# Patient Record
Sex: Male | Born: 1940 | Race: White | Hispanic: No | Marital: Married | Smoking: Former smoker
Health system: Southern US, Community
[De-identification: ages and names within clinical notes are randomized; demographics above are authoritative.]

## PROBLEM LIST (undated history)

## (undated) DIAGNOSIS — K5790 Diverticulosis of intestine, part unspecified, without perforation or abscess without bleeding: Secondary | ICD-10-CM

## (undated) DIAGNOSIS — I10 Essential (primary) hypertension: Secondary | ICD-10-CM

## (undated) DIAGNOSIS — C4442 Squamous cell carcinoma of skin of scalp and neck: Secondary | ICD-10-CM

## (undated) DIAGNOSIS — N400 Enlarged prostate without lower urinary tract symptoms: Secondary | ICD-10-CM

## (undated) DIAGNOSIS — E785 Hyperlipidemia, unspecified: Secondary | ICD-10-CM

## (undated) DIAGNOSIS — I4510 Unspecified right bundle-branch block: Secondary | ICD-10-CM

## (undated) DIAGNOSIS — K279 Peptic ulcer, site unspecified, unspecified as acute or chronic, without hemorrhage or perforation: Secondary | ICD-10-CM

## (undated) DIAGNOSIS — N529 Male erectile dysfunction, unspecified: Secondary | ICD-10-CM

## (undated) DIAGNOSIS — Z800 Family history of malignant neoplasm of digestive organs: Secondary | ICD-10-CM

## (undated) HISTORY — PX: OTHER SURGICAL HISTORY: SHX169

## (undated) HISTORY — DX: Family history of malignant neoplasm of digestive organs: Z80.0

## (undated) HISTORY — DX: Diverticulosis of intestine, part unspecified, without perforation or abscess without bleeding: K57.90

## (undated) HISTORY — DX: Essential (primary) hypertension: I10

## (undated) HISTORY — DX: Squamous cell carcinoma of skin of scalp and neck: C44.42

## (undated) HISTORY — DX: Male erectile dysfunction, unspecified: N52.9

## (undated) HISTORY — DX: Hyperlipidemia, unspecified: E78.5

## (undated) HISTORY — DX: Peptic ulcer, site unspecified, unspecified as acute or chronic, without hemorrhage or perforation: K27.9

## (undated) HISTORY — DX: Unspecified right bundle-branch block: I45.10

## (undated) HISTORY — DX: Benign prostatic hyperplasia without lower urinary tract symptoms: N40.0

---

## 2000-04-07 ENCOUNTER — Encounter (INDEPENDENT_AMBULATORY_CARE_PROVIDER_SITE_OTHER): Payer: Self-pay

## 2000-04-07 ENCOUNTER — Ambulatory Visit (HOSPITAL_COMMUNITY): Admission: RE | Admit: 2000-04-07 | Discharge: 2000-04-07 | Payer: Self-pay | Admitting: Gastroenterology

## 2000-11-02 ENCOUNTER — Encounter: Payer: Self-pay | Admitting: *Deleted

## 2000-11-02 ENCOUNTER — Encounter: Payer: Self-pay | Admitting: Internal Medicine

## 2000-11-02 ENCOUNTER — Inpatient Hospital Stay (HOSPITAL_COMMUNITY): Admission: EM | Admit: 2000-11-02 | Discharge: 2000-11-14 | Payer: Self-pay | Admitting: Emergency Medicine

## 2000-11-03 ENCOUNTER — Encounter: Payer: Self-pay | Admitting: Internal Medicine

## 2000-11-04 ENCOUNTER — Encounter: Payer: Self-pay | Admitting: Internal Medicine

## 2000-11-08 ENCOUNTER — Encounter: Payer: Self-pay | Admitting: Internal Medicine

## 2000-11-17 ENCOUNTER — Ambulatory Visit (HOSPITAL_COMMUNITY): Admission: RE | Admit: 2000-11-17 | Discharge: 2000-11-17 | Payer: Self-pay | Admitting: Family Medicine

## 2000-11-25 ENCOUNTER — Ambulatory Visit (HOSPITAL_COMMUNITY): Admission: RE | Admit: 2000-11-25 | Discharge: 2000-11-25 | Payer: Self-pay

## 2004-11-22 HISTORY — PX: COLONOSCOPY: SHX174

## 2005-06-09 ENCOUNTER — Encounter (INDEPENDENT_AMBULATORY_CARE_PROVIDER_SITE_OTHER): Payer: Self-pay | Admitting: *Deleted

## 2005-06-09 ENCOUNTER — Ambulatory Visit (HOSPITAL_COMMUNITY): Admission: RE | Admit: 2005-06-09 | Discharge: 2005-06-09 | Payer: Self-pay | Admitting: Gastroenterology

## 2006-05-11 ENCOUNTER — Ambulatory Visit: Payer: Self-pay | Admitting: Family Medicine

## 2006-05-19 ENCOUNTER — Ambulatory Visit: Payer: Self-pay | Admitting: Family Medicine

## 2007-05-24 ENCOUNTER — Ambulatory Visit: Payer: Self-pay | Admitting: Family Medicine

## 2007-06-01 ENCOUNTER — Ambulatory Visit: Payer: Self-pay | Admitting: Family Medicine

## 2007-09-05 ENCOUNTER — Ambulatory Visit: Payer: Self-pay | Admitting: Family Medicine

## 2007-09-12 ENCOUNTER — Ambulatory Visit (HOSPITAL_COMMUNITY): Admission: RE | Admit: 2007-09-12 | Discharge: 2007-09-12 | Payer: Self-pay | Admitting: Family Medicine

## 2008-03-14 ENCOUNTER — Ambulatory Visit: Payer: Self-pay | Admitting: Family Medicine

## 2008-10-08 ENCOUNTER — Ambulatory Visit: Payer: Self-pay | Admitting: Family Medicine

## 2009-04-16 ENCOUNTER — Ambulatory Visit: Payer: Self-pay | Admitting: Family Medicine

## 2009-04-24 ENCOUNTER — Ambulatory Visit: Payer: Self-pay | Admitting: Family Medicine

## 2009-10-07 ENCOUNTER — Ambulatory Visit: Payer: Self-pay | Admitting: Family Medicine

## 2010-11-10 ENCOUNTER — Ambulatory Visit: Payer: Self-pay | Admitting: Family Medicine

## 2011-01-05 ENCOUNTER — Other Ambulatory Visit: Payer: Self-pay | Admitting: Gastroenterology

## 2011-04-09 NOTE — Discharge Summary (Signed)
[place]  Patient:    GUSSIE, TOWSON Visit Number: [id number] MRN: [id number]          Service Type: MED Location: [phone] Attending Physician:  Carollee Herter Admit Date:  11/02/2000 Discharge Date: 11/14/2000                             Discharge Summary  HISTORY OF PRESENT ILLNESS:  This is a 70 year old white male who was admitted through the emergency room for evaluation and treatment of obtundation.  He has a longstanding history of binge drinking and went on another binge prior to his admission to the hospital.  He is usually able to sober up from these; however, his wife took the alcohol away and he continued to deteriorate.  He was brought to the emergency room where his sodium was confirmed on two different specimens to be less than 100, chloride was 61, his BUN and creatinine were normal.   His nasogastric secretions were guaiac positive. White blood cell count was 24,000.  ADMISSION PHYSICAL EXAMINATION:  GENERAL APPEARANCE:  An obtunded and somewhat combative male who was noted to be in sinus tachycardia.  SKIN:  Warm and dry.  HEENT:  There were multiple facial contusions, especially in the periorbital area as well as a scalp laceration.  CHEST:  There were coarse breath sounds with rales and rhonchi.  ABDOMEN:  There was slight distention.  RECTAL:  Guaiac negative.  NEUROLOGICAL:  He was agitated and combative and intubation was done in order to allow for CT scan to rule out any CNS bleed.  HOSPITAL COURSE:  He was admitted to the intensive care unit for treatment of profound hyponatremia, hypochloremia, as well as volume depletion leukocytosis.  He was given IM thiamine as well as fluids and continued sedation for respiratory support.  CT of the brain did rule out any subdural. He was also placed on antibiotics for coverage for any possible sepsis due to elevated white blood cell count.  He was  initially placed on Rocephin for antibiotic coverage. Further evaluation in the ICU showed elevated amylase and lipase as well as a low albumin.  His CPK was also noted to be 69 66 as well as a slightly elevated bilirubin.  Also through the course his blood sugar did drop; however, toward the end of his hospital stay he showed slightly elevated blood sugars.  His condition slowly improved with  proper IV fluid replacements.  Blood cultures all were negative.  He was also given parenteral nutrition.  His electrolytes slowly returned to normal as did his CPK, amylase and lipase also diminished.  Upon discharge his sodium was 135, potassium 3.7, chloride 104, CO2 27, glucose on discharge was 167.  His final hemoglobin was 8.5, hematocrit 25.8.  Final ALT before discharge was 59, total bilirubin was 1.3.  His CPK returned to near normal as well.  He was transferred to a regular floor after being extubated and started on regular fluids and diet. He slowly gained some of his strength back.  Physical therapy as well as occupational therapy consults were obtained.  He also had some difficulty with peripheral edema although no therapy was instituted for this, hoping that as he became more active, this would remedy itself.  A home health consult was obtained to follow up on his blood sugar to closely monitor that.  CONDITION ON DISCHARGE:  Improved.  FINAL DIAGNOSES:  1. Hyponatremia and hypovolemia with hypocalcemia. 2. Alcohol dependence. 3. Respiratory failure. 4. Acute pancreatitis. 5. Rhabdomyolysis. 6. History of hypertension. 7. Contusion and laceration to the face.  FOLLOW-UP: He is to follow up in my office in approximately one week.  All abnormal labs will be followed up at that time. Attending Physician:  Carollee Herter DD:  12/20/00 TD:  12/20/00 Job: [id number]

## 2011-04-09 NOTE — Procedures (Signed)
[facility]  Patient:    Maxwell Sanders, Maxwell Sanders                        MRN: [id number] Proc. Date: 04/07/00 Attending:  Verlin Grills, M.D. CC:         Ronnald Nian, M.D.                           Procedure Report  PROCEDURE:  Colonoscopy.  REFERRING PHYSICIAN:  Ronnald Nian, M.D.  INDICATION FOR PROCEDURE:  Mr. Tyquavious Gamel is a 70 year old male with a positive family history for colon cancer. He is due for surveillance colonoscopy.  I discussed with the patient the complications associated with colonoscopy and polypectomy including intestinal bleeding and intestinal perforation. The patient has signed the operative permit.  ENDOSCOPIST:  Verlin Grills, M.D.  PREMEDICATION:  Demerol 100 mg, Versed 10 mg.  ENDOSCOPE:  Olympus pediatric colonoscope.  DESCRIPTION OF PROCEDURE:  After obtaining informed consent, the patient was placed in the left lateral decubitus position. I administered intravenous Demerol and intravenous Versed to achieve conscious sedation for the procedure. The patients cardiac rhythm, oxygen saturation and blood pressure were monitored throughout the procedure and documented in the medical record.  Anal inspection was normal. Digital rectal exam revealed a non-nodular prostate. The Olympus pediatric video colonoscope was introduced into the rectum and under direct vision advanced to the cecum as identified by a normal appearing ileocecal valve. Colonic preparation for the exam today was excellent.  RECTUM:  From the distal rectum, two 1 mm sessile polyps were removed with the cold biopsy forceps and submitted for pathological interpretation.  SIGMOID COLON/DESCENDING COLON:  Colonic diverticulosis.  SPLENIC FLEXURE:  Normal.  TRANSVERSE COLON:  Normal.  HEPATIC FLEXURE:  Normal.  ASCENDING COLON:  Normal.  CECUM/ILEOCECAL VALVE:  Normal.  ASSESSMENT: 1. Left colonic diverticulosis. 2. From the  distal rectum, two 1 mm sessile polyps were removed with the cold    biopsy forceps and submitted for pathological interpretation.  RECOMMENDATION:  Repeat colonoscopy in May 2006. DD:  04/07/00 TD:  04/12/00 Job: [id number]

## 2011-04-09 NOTE — Op Note (Signed)
NAMEMILBURN, FREENEY NO.:  [id number]   MEDICAL RECORD NO.:  [id number]          PATIENT TYPE:  AMB   LOCATION:  ENDO                         FACILITY:  [facility]   PHYSICIAN:  Danise Edge, M.D.   DATE OF BIRTH:  12-28-1940   DATE OF PROCEDURE:  06/09/2005  DATE OF DISCHARGE:                                 OPERATIVE REPORT   PROCEDURE:  Colonoscopy.   INDICATIONS:  [name] is a 70 year old male born 1941-09-17. Mr.  Maxwell Sanders mother was diagnosed with colon cancer. Five years ago, Mr.  Maxwell Sanders underwent a screening colonoscopy which revealed left colonic  diverticulosis and no colorectal neoplasia.   ENDOSCOPIST:  Danise Edge, M.D.   PREMEDICATION:  Versed 9 mg, Demerol 70 mg.   DESCRIPTION OF PROCEDURE:  After obtaining informed consent Maxwell Sanders was  placed in the left lateral decubitus position. I administered intravenous  Demerol and intravenous Versed to achieve conscious sedation for the  procedure. The patient's blood pressure, oxygen saturation and cardiac  rhythm were monitored throughout the procedure and documented in the medical  record.   Anal inspection was normal. Digital rectal exam revealed a nonnodular  prostate. The Olympus adjustable pediatric colonoscope was introduced into  the rectum and advanced to the cecum. A normal-appearing appendiceal orifice  and ileocecal valve were identified. Colonic preparation for the exam today  was satisfactory.   RECTUM:  A 1 mm sessile polyp was removed from the mid rectum with the cold  biopsy forceps.  SIGMOID COLON AND DESCENDING COLON:  Extensive left colonic diverticulosis.  SPLENIC FLEXURE:  Normal.  TRANSVERSE COLON:  Normal.  HEPATIC FLEXURE:  Normal.  ASCENDING COLON:  Normal.  CECUM AND ILEOCECAL VALVE:  From the proximal cecum, a 1 mm sessile polyp  was removed with the cold biopsy forceps.   ASSESSMENT:  1.  Extensive left colonic diverticulosis.  2.  A diminutive  polyp was removed from the cecum and a diminutive polyp was      removed from the rectum.   RECOMMENDATIONS:  Repeat colonoscopy in five years signed.       MJ/MEDQ  D:  06/09/2005  T:  06/09/2005  Job:  [id number]   cc:   Sharlot Gowda, M.D.  [address]  [place], [place] [address]  Fax: [phone]
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
## 2011-08-24 ENCOUNTER — Other Ambulatory Visit: Payer: Self-pay | Admitting: Family Medicine

## 2011-11-22 ENCOUNTER — Other Ambulatory Visit: Payer: Self-pay | Admitting: Family Medicine

## 2012-04-03 ENCOUNTER — Ambulatory Visit (INDEPENDENT_AMBULATORY_CARE_PROVIDER_SITE_OTHER): Payer: Medicare Other | Admitting: Family Medicine

## 2012-04-03 VITALS — BP 140/70 | HR 84 | Temp 97.9°F | Resp 16 | Wt 180.0 lb

## 2012-04-03 DIAGNOSIS — IMO0002 Reserved for concepts with insufficient information to code with codable children: Secondary | ICD-10-CM

## 2012-04-03 DIAGNOSIS — L03113 Cellulitis of right upper limb: Secondary | ICD-10-CM

## 2012-04-03 MED ORDER — DOXYCYCLINE HYCLATE 100 MG PO TABS: 100.0000 mg | ORAL_TABLET | Freq: Two times a day (BID) | ORAL | Status: AC

## 2012-04-03 NOTE — Patient Instructions (Signed)
Wash with soap and water and if you want to you can put heat on it for 20 minutes a couple times per day and I'll see back later this week

## 2012-04-03 NOTE — Progress Notes (Signed)
  Subjective:    Patient ID: Maxwell Sanders, male    DOB: 1941-08-30, 71 y.o.   MRN: [id number]  HPI He is here for evaluation of redness and swelling of his right forearm after pulling to ticks off 2 days ago. He's had no fever, chills, malaise or myalgias.   Review of Systems     Objective:   Physical Exam Both ticks were evaluated and none had had a blood meal. Exam of the right forearm does show redness, warmth, swelling to the lateral aspect of forearm. There is some central fullness in that area.       Assessment & Plan:   1. Cellulitis of forearm, right  doxycycline (VIBRA-TABS) 100 MG tablet   he is to watch for worsening of his infection and call me. Otherwise I will see him in 3 days.

## 2012-04-07 ENCOUNTER — Encounter: Payer: Self-pay | Admitting: Family Medicine

## 2012-04-07 ENCOUNTER — Ambulatory Visit (INDEPENDENT_AMBULATORY_CARE_PROVIDER_SITE_OTHER): Payer: Medicare Other | Admitting: Family Medicine

## 2012-04-07 VITALS — Wt 180.0 lb

## 2012-04-07 DIAGNOSIS — N529 Male erectile dysfunction, unspecified: Secondary | ICD-10-CM

## 2012-04-07 DIAGNOSIS — L03113 Cellulitis of right upper limb: Secondary | ICD-10-CM

## 2012-04-07 DIAGNOSIS — IMO0002 Reserved for concepts with insufficient information to code with codable children: Secondary | ICD-10-CM

## 2012-04-07 MED ORDER — TADALAFIL 20 MG PO TABS: 20.0000 mg | ORAL_TABLET | Freq: Every day | ORAL | Status: DC | PRN

## 2012-04-07 NOTE — Progress Notes (Signed)
  Subjective:    Patient ID: Maxwell Sanders, male    DOB: 08/12/1941, 71 y.o.   MRN: [id number]  HPI He is here for a recheck after recent forearm infection and treatment with antibiotic. He states the next day the swelling and redness did diminish. He continues on Vibra-Tabs. He also has difficulty with erectile dysfunction and in the past has used Viagra and Levitra. He is not sure whether either one worked very well. He can get an erection but cannot maintain it.   Review of Systems     Objective:   Physical Exam Alert and in no distress. Exam of the right forearm shows less swelling and erythema. It is not warm or tender to touch.       Assessment & Plan:   1. Cellulitis of right forearm    2. ED (erectile dysfunction)  tadalafil (CIALIS) 20 MG tablet   he will continue on doxycycline to is gone. Discussed use of Cialis and possible side effects with him. Samples given. He will let me know how it works.

## 2012-04-18 ENCOUNTER — Telehealth: Payer: Self-pay | Admitting: Family Medicine

## 2012-04-18 DIAGNOSIS — N529 Male erectile dysfunction, unspecified: Secondary | ICD-10-CM

## 2012-04-18 MED ORDER — TADALAFIL 20 MG PO TABS: 20.0000 mg | ORAL_TABLET | Freq: Every day | ORAL | Status: DC | PRN

## 2012-04-18 NOTE — Telephone Encounter (Signed)
PT STATES THAT HE WAS SUPPOSED TO CALL us BACK IF HE WANT TO GET SCRIPT FOR CIALIS 20MG . CALLED THIS MORNING TO REQUEST SCRIPT FOR CIALIS BUT WAS TOLD THAT MED WAS SENT IN TO [facility] ON 5/17 BUT PHARMACY TOLD HIM THEY DO NOT HAVE RECORD OF THIS SO HE NEED MED SENT IN TO [email or web address]. PLEASE CALL WHEN THIS IS DONE SO PT KNOWS WHEN HE CAN GO BACK TO PHARMACY

## 2012-04-18 NOTE — Telephone Encounter (Signed)
Called pt to inform him Dr.Lalonde called pt med in for him

## 2012-04-18 NOTE — Telephone Encounter (Signed)
Let him know that I just called it in. 

## 2012-04-19 ENCOUNTER — Encounter: Payer: Self-pay | Admitting: Internal Medicine

## 2012-04-21 ENCOUNTER — Encounter: Payer: Self-pay | Admitting: Family Medicine

## 2012-04-21 ENCOUNTER — Other Ambulatory Visit: Payer: Self-pay | Admitting: Family Medicine

## 2012-04-21 ENCOUNTER — Ambulatory Visit (INDEPENDENT_AMBULATORY_CARE_PROVIDER_SITE_OTHER): Payer: Medicare Other | Admitting: Family Medicine

## 2012-04-21 VITALS — BP 138/70 | HR 86 | Ht 68.0 in | Wt 180.0 lb

## 2012-04-21 DIAGNOSIS — Z23 Encounter for immunization: Secondary | ICD-10-CM

## 2012-04-21 DIAGNOSIS — Z79899 Other long term (current) drug therapy: Secondary | ICD-10-CM

## 2012-04-21 DIAGNOSIS — I10 Essential (primary) hypertension: Secondary | ICD-10-CM

## 2012-04-21 DIAGNOSIS — J302 Other seasonal allergic rhinitis: Secondary | ICD-10-CM | POA: Insufficient documentation

## 2012-04-21 DIAGNOSIS — I152 Hypertension secondary to endocrine disorders: Secondary | ICD-10-CM | POA: Insufficient documentation

## 2012-04-21 DIAGNOSIS — R972 Elevated prostate specific antigen [PSA]: Secondary | ICD-10-CM | POA: Insufficient documentation

## 2012-04-21 DIAGNOSIS — N529 Male erectile dysfunction, unspecified: Secondary | ICD-10-CM

## 2012-04-21 DIAGNOSIS — E1169 Type 2 diabetes mellitus with other specified complication: Secondary | ICD-10-CM | POA: Insufficient documentation

## 2012-04-21 DIAGNOSIS — Z0000 Encounter for general adult medical examination without abnormal findings: Secondary | ICD-10-CM

## 2012-04-21 DIAGNOSIS — E1159 Type 2 diabetes mellitus with other circulatory complications: Secondary | ICD-10-CM

## 2012-04-21 DIAGNOSIS — J309 Allergic rhinitis, unspecified: Secondary | ICD-10-CM

## 2012-04-21 DIAGNOSIS — E119 Type 2 diabetes mellitus without complications: Secondary | ICD-10-CM

## 2012-04-21 DIAGNOSIS — E118 Type 2 diabetes mellitus with unspecified complications: Secondary | ICD-10-CM | POA: Insufficient documentation

## 2012-04-21 DIAGNOSIS — E785 Hyperlipidemia, unspecified: Secondary | ICD-10-CM

## 2012-04-21 LAB — LIPID PANEL
HDL: 52 mg/dL (ref >39)
LDL Cholesterol: 81 mg/dL (ref 0–99)
Total CHOL/HDL Ratio: 2.8 Ratio
Triglycerides: 69 mg/dL (ref <150)
VLDL: 14 mg/dL (ref 0–40)

## 2012-04-21 LAB — COMPREHENSIVE METABOLIC PANEL
AST: 23 U/L (ref 0–37)
Albumin: 4.7 g/dL (ref 3.5–5.2)
Alkaline Phosphatase: 56 U/L (ref 39–117)
BUN: 14 mg/dL (ref 6–23)
Creat: 0.93 mg/dL (ref 0.50–1.35)
Glucose, Bld: 161 mg/dL — ABNORMAL HIGH (ref 70–99)
Potassium: 4.4 mEq/L (ref 3.5–5.3)
Total Bilirubin: 0.7 mg/dL (ref 0.3–1.2)

## 2012-04-21 LAB — POCT URINALYSIS DIPSTICK
Bilirubin, UA: NEGATIVE
Blood, UA: NEGATIVE
Ketones, UA: NEGATIVE
Spec Grav, UA: 1.01
pH, UA: 7

## 2012-04-21 LAB — CBC WITH DIFFERENTIAL/PLATELET
Basophils Absolute: 0 10*3/uL (ref 0.0–0.1)
Basophils Relative: 1 % (ref 0–1)
Eosinophils Absolute: 0.1 10*3/uL (ref 0.0–0.7)
HCT: 44.9 % (ref 39.0–52.0)
MCH: 32.6 pg (ref 26.0–34.0)
MCHC: 33 g/dL (ref 30.0–36.0)
Monocytes Absolute: 0.6 10*3/uL (ref 0.1–1.0)
Monocytes Relative: 8 % (ref 3–12)
Neutro Abs: 4.7 10*3/uL (ref 1.7–7.7)
Neutrophils Relative %: 67 % (ref 43–77)
RDW: 13.1 % (ref 11.5–15.5)

## 2012-04-21 LAB — POCT UA - MICROALBUMIN: Microalbumin Ur, POC: <5 mg/dL

## 2012-04-21 MED ORDER — LISINOPRIL-HYDROCHLOROTHIAZIDE 20-12.5 MG PO TABS: 1.0000 | ORAL_TABLET | Freq: Every day | ORAL | Status: DC

## 2012-04-21 MED ORDER — METOPROLOL TARTRATE 25 MG PO TABS: 25.0000 mg | ORAL_TABLET | Freq: Two times a day (BID) | ORAL | Status: DC

## 2012-04-21 MED ORDER — TADALAFIL 20 MG PO TABS: 20.0000 mg | ORAL_TABLET | Freq: Every day | ORAL | Status: DC | PRN

## 2012-04-21 MED ORDER — SIMVASTATIN 20 MG PO TABS: 20.0000 mg | ORAL_TABLET | Freq: Every day | ORAL | Status: DC

## 2012-04-21 NOTE — Progress Notes (Signed)
Subjective:    Patient ID: Maxwell Sanders, male    DOB: Nov 26, 1940, 71 y.o.   MRN: [id number]  HPI He is here for an interval evaluation. He would like a prescription for Cialis so he can send it to a [place] company. He says he can get much cheaper that way. He would also like followup on the PSA. He does not remember [place] discussing this in the past however the record does indicate otherwise. He does have seasonal allergies and rarely takes anything for them. This usually bothers him when he goes outside and mow his lawn or in the spring. He continues on other medications listed in the chart. He does check his blood sugars periodically and they usually run below 130. He also has a lesion in the left side of his nose that intermittently gives him difficulty. His social and family history were reviewed.   Review of Systems Negative except as above    Objective:   Physical Exam BP 138/70  Pulse 86  Ht 5\' 8"  (1.727 m)  Wt 180 lb (81.647 kg)  BMI 27.37 kg/m2  SpO2 98%  General Appearance:    Alert, cooperative, no distress, appears stated age  Head:    Normocephalic, without obvious abnormality, atraumatic  Eyes:    PERRL, conjunctiva/corneas clear, EOM's intact, fundi    benign  Ears:    Normal TM's and external ear canals  Nose:   Nares normal, mucosa on the left septal area does show questionable growth, no drainage or sinus   tenderness  Throat:   Lips, mucosa, and tongue normal; teeth and gums normal  Neck:   Supple, no lymphadenopathy;  thyroid:  no   enlargement/tenderness/nodules; no carotid   bruit or JVD  Back:    Spine nontender, no curvature, ROM normal, no CVA     tenderness  Lungs:     Clear to auscultation bilaterally without wheezes, rales or     ronchi; respirations unlabored  Chest Wall:    No tenderness or deformity   Heart:    Regular rate and rhythm, S1 and S2 normal, no murmur, rub   or gallop  Breast Exam:    No chest wall tenderness, masses or gynecomastia    Abdomen:     Soft, non-tender, nondistended, normoactive bowel sounds,   no masses, no hepatosplenomegaly  Genitalia:   deferred   Rectal:    deferred   Extremities:   No clubbing, cyanosis or edema  Pulses:   2+ and symmetric all extremities  Skin:   Skin color, texture, turgor normal, no rashes or lesions  Lymph nodes:   Cervical, supraclavicular, and axillary nodes normal  Neurologic:   CNII-XII intact, normal strength, sensation and gait; reflexes 2+ and symmetric throughout          Psych:   Normal mood, affect, hygiene and grooming.           Assessment & Plan:   1. Routine general medical examination at a health care facility  POCT Urinalysis Dipstick, Tdap vaccine greater than or equal to 7yo IM, US Aorta Initial Medicare Screen  2. ED (erectile dysfunction)  tadalafil (CIALIS) 20 MG tablet  3. Elevated PSA  PSA Total+%Free (Serial)  4. Allergic rhinitis, seasonal    5. Type II or unspecified type diabetes mellitus without mention of complication, not stated as uncontrolled  POCT UA - Microalbumin, POCT HgB A1C  6. Hypertension associated with diabetes  lisinopril-hydrochlorothiazide (PRINZIDE,ZESTORETIC) 20-12.5 MG per tablet, metoprolol tartrate (  LOPRESSOR) 25 MG tablet  7. Hyperlipidemia LDL goal <70  simvastatin (ZOCOR) 20 MG tablet, Comprehensive metabolic panel, Lipid panel  8. Encounter for long-term (current) use of other medications  CBC with Differential, Comprehensive metabolic panel, Lipid panel, POCT UA - Microalbumin   9 nasal lesion                                                         I discussed referral to ENT but he would like to hold off on this. Recommend he call me when he is ready. his medications were renewed. Encouraged him to continue on his present medications as well as diet and exercise regimen.

## 2012-04-22 LAB — PSA, TOTAL AND FREE
PSA, Free Pct: 24 % (ref >25)
PSA, Free: 1.46 ng/mL

## 2012-04-24 NOTE — Progress Notes (Signed)
His PSA was elevated. I discussed the percent PSA with him and his risk of prostate cancer especially considering his age. He is comfortable with waiting. To set up an appointment for recheck in 6 months.

## 2012-04-25 ENCOUNTER — Ambulatory Visit (HOSPITAL_COMMUNITY): Payer: Medicare Other

## 2012-04-27 ENCOUNTER — Other Ambulatory Visit: Payer: Self-pay | Admitting: Family Medicine

## 2012-04-27 ENCOUNTER — Ambulatory Visit (HOSPITAL_COMMUNITY)
Admission: RE | Admit: 2012-04-27 | Discharge: 2012-04-27 | Disposition: A | Discharge status: Home / Self Care | Payer: Medicare Other | Source: Ambulatory Visit | Attending: Family Medicine | Admitting: Family Medicine

## 2012-04-27 DIAGNOSIS — I10 Essential (primary) hypertension: Secondary | ICD-10-CM | POA: Insufficient documentation

## 2012-04-27 DIAGNOSIS — Z0000 Encounter for general adult medical examination without abnormal findings: Secondary | ICD-10-CM | POA: Insufficient documentation

## 2012-04-27 DIAGNOSIS — Z87891 Personal history of nicotine dependence: Secondary | ICD-10-CM | POA: Insufficient documentation

## 2012-04-27 DIAGNOSIS — E119 Type 2 diabetes mellitus without complications: Secondary | ICD-10-CM | POA: Insufficient documentation

## 2012-12-07 ENCOUNTER — Encounter: Payer: Self-pay | Admitting: Family Medicine

## 2012-12-07 ENCOUNTER — Ambulatory Visit (INDEPENDENT_AMBULATORY_CARE_PROVIDER_SITE_OTHER): Payer: MEDICARE | Admitting: Family Medicine

## 2012-12-07 VITALS — BP 136/88 | HR 79 | Wt 185.0 lb

## 2012-12-07 DIAGNOSIS — R972 Elevated prostate specific antigen [PSA]: Secondary | ICD-10-CM

## 2012-12-07 DIAGNOSIS — Z23 Encounter for immunization: Secondary | ICD-10-CM

## 2012-12-07 DIAGNOSIS — E119 Type 2 diabetes mellitus without complications: Secondary | ICD-10-CM

## 2012-12-07 DIAGNOSIS — I152 Hypertension secondary to endocrine disorders: Secondary | ICD-10-CM

## 2012-12-07 DIAGNOSIS — E1169 Type 2 diabetes mellitus with other specified complication: Secondary | ICD-10-CM

## 2012-12-07 DIAGNOSIS — I10 Essential (primary) hypertension: Secondary | ICD-10-CM

## 2012-12-07 DIAGNOSIS — E1159 Type 2 diabetes mellitus with other circulatory complications: Secondary | ICD-10-CM

## 2012-12-07 NOTE — Patient Instructions (Signed)
Work on getting your weight down and as long as your blood sugars 2 hours after you eat don't  go close to 300 I'm not going to worry

## 2012-12-07 NOTE — Progress Notes (Signed)
  Subjective:    Patient ID: Maxwell Sanders, male    DOB: 11/08/1941, 72 y.o.   MRN: [id number]  HPI He is here for an interval evaluation. He has a previous history of elevated PSA. This was discussed with him in the past and it was decided to observe this. He was comfortable with this approach and is here for followup on his PSA. Also of note is the fact that his blood sugars have been slowly increasing. He did get his weight down to 165 and did note that his blood sugars did return to normal. Recently he has noted blood sugars after eating in the 190-200 range which has him quite concerned.   Review of Systems     Objective:   Physical Exam Alert and in no distress. Hemoglobin A1c 7.7       Assessment & Plan:   1. Hypertension associated with diabetes    2. Type II or unspecified type diabetes mellitus without mention of complication, not stated as uncontrolled  POCT glycosylated hemoglobin (Hb A1C)  3. Elevated PSA  PSA Total+%Free (Serial)   I discussed the hemoglobin A1c of 7.7 in regard to weight reduction and medications. He will attempt to get his weight down instead of being placed on medications. Check this in 4 months.

## 2012-12-08 LAB — PSA, TOTAL AND FREE: PSA, Free: 1.69 ng/mL

## 2012-12-11 NOTE — Progress Notes (Signed)
Quick Note:  CALLED PT INFORMED HIM OF HIS LABS AND HAVE REFERRED HIM TO UROLOGY ______

## 2013-01-31 ENCOUNTER — Telehealth: Payer: Self-pay | Admitting: Family Medicine

## 2013-01-31 NOTE — Telephone Encounter (Signed)
CHANGING PROVIDERS FOR HIS MEDICAL SUPPLIES  NEEDS NEW RX FOR HIS   90 day supply  Lancets, solution and strips  Bayer countour meter    [facility]

## 2013-02-01 ENCOUNTER — Other Ambulatory Visit: Payer: Self-pay

## 2013-02-01 MED ORDER — LANCETS MISC: 1.0000 | Freq: Every day | Status: DC

## 2013-02-01 MED ORDER — BAYER CONTOUR MONITOR W/DEVICE KIT: 1.0000 | PACK | Freq: Once | Status: DC

## 2013-02-01 MED ORDER — GLUCOSE BLOOD VI STRP: 1.0000 | ORAL_STRIP | Freq: Every day | Status: DC

## 2013-02-01 NOTE — Telephone Encounter (Signed)
Take care of this 

## 2013-02-01 NOTE — Telephone Encounter (Signed)
Sent in BAYER CONTOUR METER,STRIPS,LANCETS,TO [facility] PER NOTE

## 2013-02-02 ENCOUNTER — Other Ambulatory Visit: Payer: Self-pay

## 2013-02-02 MED ORDER — GLUCOSE BLOOD VI STRP: 1.0000 | ORAL_STRIP | Freq: Two times a day (BID) | Status: DC

## 2013-02-02 MED ORDER — LANCETS MISC: 1.0000 | Freq: Two times a day (BID) | Status: DC

## 2013-02-02 MED ORDER — BAYER CONTOUR MONITOR W/DEVICE KIT: 1.0000 | PACK | Freq: Two times a day (BID) | Status: DC

## 2013-02-02 NOTE — Telephone Encounter (Signed)
HAD TO RESEND DIABETES SUPPLY

## 2013-02-05 ENCOUNTER — Other Ambulatory Visit: Payer: Self-pay

## 2013-03-14 LAB — HM DIABETES EYE EXAM

## 2013-03-27 ENCOUNTER — Encounter: Payer: Self-pay | Admitting: Internal Medicine

## 2013-05-28 ENCOUNTER — Other Ambulatory Visit: Payer: Self-pay | Admitting: Family Medicine

## 2013-06-21 ENCOUNTER — Encounter: Payer: Self-pay | Admitting: Family Medicine

## 2013-06-21 ENCOUNTER — Ambulatory Visit (INDEPENDENT_AMBULATORY_CARE_PROVIDER_SITE_OTHER): Payer: Medicare Other | Admitting: Family Medicine

## 2013-06-21 VITALS — BP 160/72 | HR 85 | Ht 68.0 in | Wt 181.0 lb

## 2013-06-21 DIAGNOSIS — I10 Essential (primary) hypertension: Secondary | ICD-10-CM

## 2013-06-21 DIAGNOSIS — J309 Allergic rhinitis, unspecified: Secondary | ICD-10-CM

## 2013-06-21 DIAGNOSIS — N400 Enlarged prostate without lower urinary tract symptoms: Secondary | ICD-10-CM | POA: Insufficient documentation

## 2013-06-21 DIAGNOSIS — Z0000 Encounter for general adult medical examination without abnormal findings: Secondary | ICD-10-CM

## 2013-06-21 DIAGNOSIS — E119 Type 2 diabetes mellitus without complications: Secondary | ICD-10-CM

## 2013-06-21 DIAGNOSIS — Z23 Encounter for immunization: Secondary | ICD-10-CM

## 2013-06-21 DIAGNOSIS — R972 Elevated prostate specific antigen [PSA]: Secondary | ICD-10-CM

## 2013-06-21 DIAGNOSIS — J302 Other seasonal allergic rhinitis: Secondary | ICD-10-CM

## 2013-06-21 DIAGNOSIS — E785 Hyperlipidemia, unspecified: Secondary | ICD-10-CM

## 2013-06-21 DIAGNOSIS — I152 Hypertension secondary to endocrine disorders: Secondary | ICD-10-CM

## 2013-06-21 DIAGNOSIS — E1169 Type 2 diabetes mellitus with other specified complication: Secondary | ICD-10-CM

## 2013-06-21 DIAGNOSIS — N138 Other obstructive and reflux uropathy: Secondary | ICD-10-CM

## 2013-06-21 DIAGNOSIS — N401 Enlarged prostate with lower urinary tract symptoms: Secondary | ICD-10-CM

## 2013-06-21 LAB — POCT UA - MICROALBUMIN
Albumin/Creatinine Ratio, Urine, POC: 26.9
Creatinine, POC: 158.5 mg/dL

## 2013-06-21 LAB — COMPREHENSIVE METABOLIC PANEL
AST: 32 U/L (ref 0–37)
Albumin: 4.9 g/dL (ref 3.5–5.2)
Alkaline Phosphatase: 79 U/L (ref 39–117)
BUN: 13 mg/dL (ref 6–23)
Calcium: 9.9 mg/dL (ref 8.4–10.5)
Chloride: 97 mEq/L (ref 96–112)
Glucose, Bld: 200 mg/dL — ABNORMAL HIGH (ref 70–99)
Potassium: 4.3 mEq/L (ref 3.5–5.3)
Sodium: 137 mEq/L (ref 135–145)
Total Protein: 7.5 g/dL (ref 6.0–8.3)

## 2013-06-21 LAB — CBC WITH DIFFERENTIAL/PLATELET
HCT: 44.7 % (ref 39.0–52.0)
Hemoglobin: 15.1 g/dL (ref 13.0–17.0)
Lymphocytes Relative: 12 % (ref 12–46)
Monocytes Absolute: 1.2 10*3/uL — ABNORMAL HIGH (ref 0.1–1.0)
Monocytes Relative: 10 % (ref 3–12)
Neutro Abs: 9.4 10*3/uL — ABNORMAL HIGH (ref 1.7–7.7)
Neutrophils Relative %: 76 % (ref 43–77)
RBC: 4.71 MIL/uL (ref 4.22–5.81)
WBC: 12.2 10*3/uL — ABNORMAL HIGH (ref 4.0–10.5)

## 2013-06-21 LAB — LIPID PANEL
HDL: 66 mg/dL (ref >39)
LDL Cholesterol: 73 mg/dL (ref 0–99)
Triglycerides: 95 mg/dL (ref <150)

## 2013-06-21 MED ORDER — TADALAFIL 5 MG PO TABS: ORAL_TABLET | ORAL | Status: DC

## 2013-06-21 NOTE — Progress Notes (Signed)
  Subjective:    Patient ID: Maxwell Sanders, male    DOB: [birth year], 72 y.o.   MRN: [id number]  HPI He is here for an examination. He does have a history of elevated PSA however his had biopsies which were negative. He does note that when he uses Cialis, he has much less nocturia. Apparently a 20 mg tablet will help his nocturia for about 4 or 5 days. His allergies are under good control. He continues on his statin as well as blood pressure medication. He checks his blood sugars intermittently. He eats whatever he wants. His exercise is mainly doing yard work . He does not smoke and rarely drinks. Family and social history are recorded   Review of Systems Negative except as above    Objective:   Physical Exam BP 160/72  Pulse 85  Ht 5\' 8"  (1.727 m)  Wt 181 lb (82.101 kg)  BMI 27.53 kg/m2  General Appearance:    Alert, cooperative, no distress, appears stated age  Head:    Normocephalic, without obvious abnormality, atraumatic  Eyes:    PERRL, conjunctiva/corneas clear, EOM's intact, fundi    benign  Ears:    Normal TM's and external ear canals  Nose:   Nares normal, mucosa normal, no drainage or sinus   tenderness  Throat:   Lips, mucosa, and tongue normal; teeth and gums normal  Neck:   Supple, no lymphadenopathy;  thyroid:  no   enlargement/tenderness/nodules; no carotid   bruit or JVD  Back:    Spine nontender, no curvature, ROM normal, no CVA     tenderness  Lungs:     Clear to auscultation bilaterally without wheezes, rales or     ronchi; respirations unlabored  Chest Wall:    No tenderness or deformity   Heart:    Regular rate and rhythm, S1 and S2 normal, no murmur, rub   or gallop  Breast Exam:    No chest wall tenderness, masses or gynecomastia  Abdomen:     Soft, non-tender, nondistended, normoactive bowel sounds,    no masses, no hepatosplenomegaly        Extremities:   No clubbing, cyanosis or edema  Pulses:   2+ and symmetric all extremities  Skin:   Skin color,  texture, turgor normal, no rashes or lesions  Lymph nodes:   Cervical, supraclavicular, and axillary nodes normal  Neurologic:   CNII-XII intact, normal strength, sensation and gait; reflexes 2+ and symmetric throughout          Psych:   Normal mood, affect, hygiene and grooming.   Hb A1c is 7.1       Assessment & Plan:  Type II or unspecified type diabetes mellitus without mention of complication, not stated as uncontrolled - Plan: POCT glycosylated hemoglobin (Hb A1C), POCT UA - Microalbumin, CBC with Differential, Comprehensive metabolic panel, Lipid panel  Allergic rhinitis, seasonal  Elevated PSA  Hyperlipidemia LDL goal <70 - Plan: Lipid panel  Hypertension associated with diabetes - Plan: CBC with Differential, Comprehensive metabolic panel  Hypertrophy of prostate with urinary obstruction and other lower urinary tract symptoms (LUTS) - Plan: tadalafil (CIALIS) 5 MG tablet  Immunization due - Plan: Pneumococcal conjugate vaccine 13-valent less than 5yo IM  did write a prescription for Cialis to see if his insurance will cover this however if it does not another prescription was written and he will send this off to [place].

## 2013-07-31 ENCOUNTER — Telehealth: Payer: Self-pay

## 2013-07-31 NOTE — Telephone Encounter (Signed)
DR.LALONDE THE PATIENT TOPED BY THIS MORNING AND SAID INS. WILL NOT PAY FOR HIS CIALIS BUT HE BROUGHT IN A ORDER FORM FROM [place] AND ASKED IF YOU WOULD GIVE AN RX FOR 20 MG # 100 BECAUSE IT IS THE CHEAPEST HE SAID YOU HAD HIM ON 5 MG DAILY AND HE SAID HE COULD TAKE THE 20MG  EVERY 3 DAYS AND IT WORKS THE SAME SO PLEASE ADVISE I HAVE THE FORM AND PRICE LIST

## 2013-07-31 NOTE — Telephone Encounter (Signed)
FAXED RX IN [place] BY [place]

## 2014-01-09 ENCOUNTER — Ambulatory Visit (INDEPENDENT_AMBULATORY_CARE_PROVIDER_SITE_OTHER): Payer: Medicare HMO | Admitting: Family Medicine

## 2014-01-09 ENCOUNTER — Encounter: Payer: Self-pay | Admitting: Family Medicine

## 2014-01-09 VITALS — BP 150/90 | HR 68 | Wt 184.0 lb

## 2014-01-09 DIAGNOSIS — R338 Other retention of urine: Secondary | ICD-10-CM

## 2014-01-09 LAB — POCT URINALYSIS DIPSTICK
Bilirubin, UA: NEGATIVE
Glucose, UA: 250
Ketones, UA: NEGATIVE
Leukocytes, UA: NEGATIVE
Nitrite, UA: NEGATIVE
SPEC GRAV UA: 1.015
UROBILINOGEN UA: 0.2
pH, UA: 7

## 2014-01-09 NOTE — Progress Notes (Signed)
   Subjective:    Patient ID: Maxwell Sanders, male    DOB: Jul 16, 1941, 73 y.o.   MRN: [id number]  HPI He complains of a one-day history of urinary hesitancy, decreased stream and feeling of incomplete emptying. He last emptied his bladder or 1:30 in the morning and has not been able to since then and now complains of lower abdominal discomfort.   Review of Systems     Objective:   Physical Exam Alert and in moderate distress. Urine dipstick did show red cells and glucose. Abdominal exam shows questionable fullness in the suprapubic area and pain on palpation.       Assessment & Plan:  Acute urinary retention - Plan: POCT urinalysis dipstick, Ambulatory referral to Urology  I will send him over to urology today for further evaluation and treatment.

## 2014-02-19 ENCOUNTER — Other Ambulatory Visit: Payer: Self-pay | Admitting: Family Medicine

## 2014-06-11 ENCOUNTER — Telehealth: Payer: Self-pay

## 2014-06-11 NOTE — Telephone Encounter (Signed)
Pt was informed that he was past due for his diabetes check he said he didn't know he was suppose to have it checked every 4 mths but he will call back to make an appointment for his physical

## 2014-06-11 NOTE — Telephone Encounter (Signed)
Called pt to inform him that he is past

## 2014-06-28 ENCOUNTER — Telehealth: Payer: Self-pay

## 2014-06-28 NOTE — Telephone Encounter (Signed)
PT INFORMED AND SAID HE DIDN'T KNOW ANYTHING ABOUT COMING IN EVERY 4 MTH FOR HIS DIABETES CHECK THAT HE WOULD SCHEDULE HIS YEARLY PHY

## 2014-07-08 ENCOUNTER — Ambulatory Visit (INDEPENDENT_AMBULATORY_CARE_PROVIDER_SITE_OTHER): Payer: Medicare HMO | Admitting: Family Medicine

## 2014-07-08 ENCOUNTER — Encounter: Payer: Self-pay | Admitting: Family Medicine

## 2014-07-08 VITALS — BP 150/80 | HR 70 | Ht 67.5 in | Wt 186.0 lb

## 2014-07-08 DIAGNOSIS — E1169 Type 2 diabetes mellitus with other specified complication: Secondary | ICD-10-CM

## 2014-07-08 DIAGNOSIS — I152 Hypertension secondary to endocrine disorders: Secondary | ICD-10-CM

## 2014-07-08 DIAGNOSIS — J302 Other seasonal allergic rhinitis: Secondary | ICD-10-CM

## 2014-07-08 DIAGNOSIS — E785 Hyperlipidemia, unspecified: Secondary | ICD-10-CM

## 2014-07-08 DIAGNOSIS — E1159 Type 2 diabetes mellitus with other circulatory complications: Secondary | ICD-10-CM

## 2014-07-08 DIAGNOSIS — R972 Elevated prostate specific antigen [PSA]: Secondary | ICD-10-CM

## 2014-07-08 DIAGNOSIS — I10 Essential (primary) hypertension: Secondary | ICD-10-CM

## 2014-07-08 DIAGNOSIS — J309 Allergic rhinitis, unspecified: Secondary | ICD-10-CM

## 2014-07-08 DIAGNOSIS — E119 Type 2 diabetes mellitus without complications: Secondary | ICD-10-CM

## 2014-07-08 DIAGNOSIS — N400 Enlarged prostate without lower urinary tract symptoms: Secondary | ICD-10-CM

## 2014-07-08 DIAGNOSIS — I4510 Unspecified right bundle-branch block: Secondary | ICD-10-CM

## 2014-07-08 LAB — POCT URINALYSIS DIPSTICK
Bilirubin, UA: NEGATIVE
Blood, UA: NEGATIVE
Glucose, UA: NEGATIVE
KETONES UA: NEGATIVE
LEUKOCYTES UA: NEGATIVE
Nitrite, UA: NEGATIVE
PROTEIN UA: NEGATIVE
Spec Grav, UA: 1.01
Urobilinogen, UA: NEGATIVE
pH, UA: 6

## 2014-07-08 LAB — POCT UA - MICROALBUMIN
Albumin/Creatinine Ratio, Urine, POC: <9.9
Creatinine, POC: 50.6 mg/dL

## 2014-07-08 LAB — POCT GLYCOSYLATED HEMOGLOBIN (HGB A1C): Hemoglobin A1C: 7.1

## 2014-07-08 MED ORDER — METOPROLOL TARTRATE 50 MG PO TABS: 50.0000 mg | ORAL_TABLET | Freq: Two times a day (BID) | ORAL | Status: DC

## 2014-07-08 NOTE — Progress Notes (Signed)
   Subjective:    Patient ID: Maxwell Sanders, male    DOB: 03/04/1941, 73 y.o.   MRN: [id number]  HPI    Review of Systems     Objective:   Physical Exam        Assessment & Plan:  EKG does show evidence of RBBB

## 2014-07-08 NOTE — Progress Notes (Signed)
   Subjective:    Patient ID: Maxwell Sanders, male    DOB: 05-31-1941, 73 y.o.   MRN: [id number]  HPI He is here for an interval evaluation. He is getting ready to have nasal surgery and the surgeon mention getting an EKG for general purposes. He's had no chest pain or shortness of breath or PND. He did have difficulty earlier this year with urinary retention. He does have a history of elevated PSA and BPH. At this point he seems to be doing fairly well. His allergies are under good control and rarely give him difficulty. He continues on his present medication regimen as well. He is to get an eye exam in November. Social and family history were reviewed.   Review of Systems  All other systems reviewed and are negative.      Objective:   Physical Exam alert and in no distress. Tympanic membranes and canals are normal. Throat is clear. Tonsils are normal. Neck is supple without adenopathy or thyromegaly. Cardiac exam shows a regular sinus rhythm without murmurs or gallops. Lungs are clear to auscultation. Lower extremity exam shows good pulses and reflexes.       Assessment & Plan:  BPH (benign prostatic hyperplasia)  Type II or unspecified type diabetes mellitus without mention of complication, not stated as uncontrolled - Plan: POCT glycosylated hemoglobin (Hb A1C), POCT UA - Microalbumin, POCT Urinalysis Dipstick, CBC with Differential, Comprehensive metabolic panel, Lipid panel, EKG 12-Lead, Ankle brachial index  Hypertension associated with diabetes - Plan: CBC with Differential, Comprehensive metabolic panel, Lipid panel, EKG 12-Lead, metoprolol (LOPRESSOR) 50 MG tablet  Hyperlipidemia LDL goal <70 - Plan: Lipid panel  Allergic rhinitis, seasonal  Elevated PSA

## 2014-07-09 ENCOUNTER — Ambulatory Visit (HOSPITAL_COMMUNITY)
Admission: RE | Admit: 2014-07-09 | Discharge: 2014-07-09 | Disposition: A | Discharge status: Home / Self Care | Payer: Medicare HMO | Source: Ambulatory Visit | Attending: Family Medicine | Admitting: Family Medicine

## 2014-07-09 DIAGNOSIS — E119 Type 2 diabetes mellitus without complications: Secondary | ICD-10-CM | POA: Insufficient documentation

## 2014-07-09 DIAGNOSIS — I739 Peripheral vascular disease, unspecified: Secondary | ICD-10-CM | POA: Insufficient documentation

## 2014-07-09 LAB — CBC WITH DIFFERENTIAL/PLATELET
BASOS ABS: 0.1 10*3/uL (ref 0.0–0.1)
Basophils Relative: 1 % (ref 0–1)
Eosinophils Absolute: 0.1 10*3/uL (ref 0.0–0.7)
Eosinophils Relative: 1 % (ref 0–5)
HEMATOCRIT: 40.9 % (ref 39.0–52.0)
Hemoglobin: 13.9 g/dL (ref 13.0–17.0)
LYMPHS PCT: 19 % (ref 12–46)
Lymphs Abs: 1.5 10*3/uL (ref 0.7–4.0)
MCH: 31.9 pg (ref 26.0–34.0)
MCHC: 34 g/dL (ref 30.0–36.0)
MCV: 93.8 fL (ref 78.0–100.0)
MONO ABS: 0.6 10*3/uL (ref 0.1–1.0)
Monocytes Relative: 8 % (ref 3–12)
NEUTROS ABS: 5.8 10*3/uL (ref 1.7–7.7)
NEUTROS PCT: 71 % (ref 43–77)
PLATELETS: 222 10*3/uL (ref 150–400)
RBC: 4.36 MIL/uL (ref 4.22–5.81)
RDW: 12.8 % (ref 11.5–15.5)
WBC: 8.1 10*3/uL (ref 4.0–10.5)

## 2014-07-09 LAB — COMPREHENSIVE METABOLIC PANEL
ALT: 17 U/L (ref 0–53)
AST: 19 U/L (ref 0–37)
Albumin: 4.3 g/dL (ref 3.5–5.2)
Alkaline Phosphatase: 64 U/L (ref 39–117)
BUN: 9 mg/dL (ref 6–23)
CALCIUM: 9.1 mg/dL (ref 8.4–10.5)
CHLORIDE: 104 meq/L (ref 96–112)
CO2: 23 mEq/L (ref 19–32)
Creat: 0.68 mg/dL (ref 0.50–1.35)
Glucose, Bld: 126 mg/dL — ABNORMAL HIGH (ref 70–99)
POTASSIUM: 4 meq/L (ref 3.5–5.3)
SODIUM: 138 meq/L (ref 135–145)
Total Bilirubin: 0.7 mg/dL (ref 0.2–1.2)
Total Protein: 6.3 g/dL (ref 6.0–8.3)

## 2014-07-09 LAB — LIPID PANEL
Cholesterol: 119 mg/dL (ref 0–200)
HDL: 44 mg/dL (ref >39)
LDL Cholesterol: 62 mg/dL (ref 0–99)
Total CHOL/HDL Ratio: 2.7 Ratio
Triglycerides: 63 mg/dL (ref <150)
VLDL: 13 mg/dL (ref 0–40)

## 2014-07-09 NOTE — Progress Notes (Signed)
VASCULAR LAB PRELIMINARY  ARTERIAL  ABI completed:    RIGHT    LEFT    PRESSURE WAVEFORM  PRESSURE WAVEFORM  BRACHIAL 129 triphasic BRACHIAL 128 triphasic  DP   DP    AT 149 triphasic AT 97 monophasic  PT 142 triphasic PT 82 monophasic  PER   PER    GREAT TOE  NA GREAT TOE  NA    RIGHT LEFT  ABI >1.0 0.75     Real Cona, RVT 07/09/2014, 12:15 PM

## 2014-07-16 ENCOUNTER — Other Ambulatory Visit: Payer: Self-pay | Admitting: Family Medicine

## 2014-08-18 ENCOUNTER — Other Ambulatory Visit: Payer: Self-pay | Admitting: Family Medicine

## 2014-11-27 ENCOUNTER — Telehealth: Payer: Self-pay | Admitting: Internal Medicine

## 2014-11-27 MED ORDER — LISINOPRIL-HYDROCHLOROTHIAZIDE 20-12.5 MG PO TABS: 1.0000 | ORAL_TABLET | Freq: Every day | ORAL | Status: DC

## 2014-11-27 MED ORDER — SIMVASTATIN 20 MG PO TABS: 20.0000 mg | ORAL_TABLET | Freq: Every day | ORAL | Status: DC

## 2014-11-27 NOTE — Telephone Encounter (Signed)
Done

## 2014-11-27 NOTE — Telephone Encounter (Signed)
Pt needs a refill on lisinopril/hctz and simvastatin to [facility] [address] [place]

## 2015-02-17 ENCOUNTER — Other Ambulatory Visit: Payer: Self-pay | Admitting: Family Medicine

## 2015-05-30 ENCOUNTER — Telehealth: Payer: Self-pay | Admitting: Family Medicine

## 2015-05-30 ENCOUNTER — Other Ambulatory Visit: Payer: Self-pay

## 2015-05-30 MED ORDER — LISINOPRIL-HYDROCHLOROTHIAZIDE 20-12.5 MG PO TABS: 1.0000 | ORAL_TABLET | Freq: Every day | ORAL | Status: DC

## 2015-05-30 MED ORDER — SIMVASTATIN 20 MG PO TABS: 20.0000 mg | ORAL_TABLET | Freq: Every day | ORAL | Status: DC

## 2015-05-30 NOTE — Telephone Encounter (Signed)
done

## 2015-05-30 NOTE — Telephone Encounter (Signed)
Pt called and scheduled his medcheck plus. Pt needs refills on lisinopril and zocor sent to [facility] on [address] in [place]

## 2015-07-11 ENCOUNTER — Encounter: Payer: Self-pay | Admitting: Family Medicine

## 2015-07-11 ENCOUNTER — Ambulatory Visit (INDEPENDENT_AMBULATORY_CARE_PROVIDER_SITE_OTHER): Payer: Medicare HMO | Admitting: Family Medicine

## 2015-07-11 VITALS — BP 118/50 | HR 60 | Ht 68.5 in | Wt 178.0 lb

## 2015-07-11 DIAGNOSIS — E1169 Type 2 diabetes mellitus with other specified complication: Secondary | ICD-10-CM | POA: Diagnosis not present

## 2015-07-11 DIAGNOSIS — R972 Elevated prostate specific antigen [PSA]: Secondary | ICD-10-CM

## 2015-07-11 DIAGNOSIS — J302 Other seasonal allergic rhinitis: Secondary | ICD-10-CM | POA: Diagnosis not present

## 2015-07-11 DIAGNOSIS — E785 Hyperlipidemia, unspecified: Secondary | ICD-10-CM | POA: Diagnosis not present

## 2015-07-11 DIAGNOSIS — I10 Essential (primary) hypertension: Secondary | ICD-10-CM

## 2015-07-11 DIAGNOSIS — I152 Hypertension secondary to endocrine disorders: Secondary | ICD-10-CM

## 2015-07-11 DIAGNOSIS — N400 Enlarged prostate without lower urinary tract symptoms: Secondary | ICD-10-CM

## 2015-07-11 DIAGNOSIS — E1159 Type 2 diabetes mellitus with other circulatory complications: Secondary | ICD-10-CM

## 2015-07-11 DIAGNOSIS — E119 Type 2 diabetes mellitus without complications: Secondary | ICD-10-CM | POA: Diagnosis not present

## 2015-07-11 DIAGNOSIS — N528 Other male erectile dysfunction: Secondary | ICD-10-CM | POA: Diagnosis not present

## 2015-07-11 LAB — POCT UA - MICROALBUMIN
ALBUMIN/CREATININE RATIO, URINE, POC: 8.3
Creatinine, POC: 104.4 mg/dL
MICROALBUMIN (UR) POC: 8.7 mg/L

## 2015-07-11 LAB — COMPREHENSIVE METABOLIC PANEL
ALK PHOS: 69 U/L (ref 40–115)
ALT: 20 U/L (ref 9–46)
AST: 20 U/L (ref 10–35)
Albumin: 4.3 g/dL (ref 3.6–5.1)
BUN: 11 mg/dL (ref 7–25)
CHLORIDE: 100 mmol/L (ref 98–110)
CO2: 26 mmol/L (ref 20–31)
Calcium: 9.5 mg/dL (ref 8.6–10.3)
Creat: 0.73 mg/dL (ref 0.70–1.18)
Glucose, Bld: 128 mg/dL — ABNORMAL HIGH (ref 65–99)
Potassium: 4.1 mmol/L (ref 3.5–5.3)
Sodium: 138 mmol/L (ref 135–146)
TOTAL PROTEIN: 6.9 g/dL (ref 6.1–8.1)
Total Bilirubin: 0.9 mg/dL (ref 0.2–1.2)

## 2015-07-11 LAB — CBC WITH DIFFERENTIAL/PLATELET
BASOS ABS: 0.1 10*3/uL (ref 0.0–0.1)
BASOS PCT: 1 % (ref 0–1)
EOS ABS: 0.2 10*3/uL (ref 0.0–0.7)
Eosinophils Relative: 3 % (ref 0–5)
HCT: 42.5 % (ref 39.0–52.0)
Hemoglobin: 14.6 g/dL (ref 13.0–17.0)
Lymphocytes Relative: 28 % (ref 12–46)
Lymphs Abs: 2.2 10*3/uL (ref 0.7–4.0)
MCH: 33 pg (ref 26.0–34.0)
MCHC: 34.4 g/dL (ref 30.0–36.0)
MCV: 96.2 fL (ref 78.0–100.0)
MPV: 9.8 fL (ref 8.6–12.4)
Monocytes Absolute: 0.7 10*3/uL (ref 0.1–1.0)
Monocytes Relative: 9 % (ref 3–12)
NEUTROS PCT: 59 % (ref 43–77)
Neutro Abs: 4.7 10*3/uL (ref 1.7–7.7)
PLATELETS: 221 10*3/uL (ref 150–400)
RBC: 4.42 MIL/uL (ref 4.22–5.81)
RDW: 12.7 % (ref 11.5–15.5)
WBC: 8 10*3/uL (ref 4.0–10.5)

## 2015-07-11 LAB — LIPID PANEL
CHOL/HDL RATIO: 2.6 ratio (ref <=5.0)
Cholesterol: 127 mg/dL (ref 125–200)
HDL: 49 mg/dL (ref >=40)
LDL Cholesterol: 63 mg/dL (ref <130)
Triglycerides: 74 mg/dL (ref <150)
VLDL: 15 mg/dL (ref <30)

## 2015-07-11 LAB — POCT GLYCOSYLATED HEMOGLOBIN (HGB A1C): Hemoglobin A1C: 6.8

## 2015-07-11 MED ORDER — GLUCOSE BLOOD VI STRP: 1.0000 | ORAL_STRIP | Freq: Every day | Status: DC

## 2015-07-11 MED ORDER — SIMVASTATIN 20 MG PO TABS: 20.0000 mg | ORAL_TABLET | Freq: Every day | ORAL | Status: DC

## 2015-07-11 MED ORDER — TADALAFIL 20 MG PO TABS: 10.0000 mg | ORAL_TABLET | ORAL | Status: DC | PRN

## 2015-07-11 MED ORDER — METOPROLOL TARTRATE 50 MG PO TABS: 50.0000 mg | ORAL_TABLET | Freq: Two times a day (BID) | ORAL | Status: DC

## 2015-07-11 MED ORDER — LISINOPRIL-HYDROCHLOROTHIAZIDE 20-12.5 MG PO TABS: 1.0000 | ORAL_TABLET | Freq: Every day | ORAL | Status: DC

## 2015-07-11 MED ORDER — MICROLET LANCETS MISC: Status: DC

## 2015-07-11 NOTE — Progress Notes (Signed)
   Subjective:    Patient ID: Maxwell Sanders, male    DOB: 06-18-1941, 74 y.o.   MRN: [id number]  HPI He is here for a medication check. He does see his urologist regularly and has his PSA tested there. He would like to be screened for colon cancer. He does have underlying diabetes and does intermittently check his blood sugar.He states his blood sugars run in the 120 range. His exercise is fairly adequate. His eating habits are unchanged. He does check his feet periodically. He does need an eye exam and will schedule that. He continues on medications listed in the chart. They were reviewed with him. His allergies are under good control. He would like a prescription for Cialis. [place] Health maintenance and immunization as well as family and social history was reviewed. His had no abdominal pain, chest pain, shortness of breath.  Review of Systems  All other systems reviewed and are negative.      Objective:   Physical Exam Alert and in no distress. Tympanic membranes and canals are normal. Pharyngeal area is normal. Neck is supple without adenopathy or thyromegaly. Cardiac exam shows a regular sinus rhythm without murmurs or gallops. Lungs are clear to auscultation.Abdominal exam shows no masses or tenderness with normal bowel sounds. Hb A1c is 6.8       Assessment & Plan:  Diabetes mellitus type 2, controlled - Plan: POCT glycosylated hemoglobin (Hb A1C), POCT UA - Microalbumin, CBC with Differential/Platelet, Comprehensive metabolic panel, Lipid panel, MICROLET LANCETS MISC, glucose blood (BAYER CONTOUR TEST) test strip  Elevated PSA  Allergic rhinitis, seasonal  Hypertension associated with diabetes - Plan: CBC with Differential/Platelet, Comprehensive metabolic panel, lisinopril-hydrochlorothiazide (PRINZIDE,ZESTORETIC) 20-12.5 MG per tablet, metoprolol (LOPRESSOR) 50 MG tablet  Hyperlipidemia associated with type 2 diabetes mellitus - Plan: Lipid  panel, simvastatin (ZOCOR) 20 MG tablet  BPH (benign prostatic hyperplasia)  Other male erectile dysfunction - Plan: tadalafil (CIALIS) 20 MG tablet Will order Cologard for colon cancer screening. Discussed general health and wellness with him. Strongly encouraged him to check his sugars regularly which he has been doing. Over 45 minutes, greater than 50% spent in counseling and coordination of care.Recommend he return here in 4 months for follow-up.

## 2015-08-04 LAB — COLOGUARD: Cologuard: NEGATIVE

## 2015-08-05 LAB — HM COLONOSCOPY: HM Colonoscopy: NORMAL

## 2015-08-11 ENCOUNTER — Telehealth: Payer: Self-pay

## 2015-08-11 NOTE — Telephone Encounter (Signed)
Received results from [facility] normal pt informed and verbalized understanding

## 2015-08-20 ENCOUNTER — Encounter: Payer: Self-pay | Admitting: Family Medicine

## 2015-10-23 DIAGNOSIS — Z961 Presence of intraocular lens: Secondary | ICD-10-CM | POA: Diagnosis not present

## 2015-10-23 DIAGNOSIS — H11042 Peripheral pterygium, stationary, left eye: Secondary | ICD-10-CM | POA: Diagnosis not present

## 2015-10-23 DIAGNOSIS — H43813 Vitreous degeneration, bilateral: Secondary | ICD-10-CM | POA: Diagnosis not present

## 2015-10-23 DIAGNOSIS — E119 Type 2 diabetes mellitus without complications: Secondary | ICD-10-CM | POA: Diagnosis not present

## 2015-10-23 LAB — HM DIABETES EYE EXAM

## 2015-10-27 ENCOUNTER — Telehealth: Payer: Self-pay

## 2015-10-27 ENCOUNTER — Encounter: Payer: Self-pay | Admitting: Family Medicine

## 2015-10-27 MED ORDER — GLUCOSE BLOOD VI STRP: 1.0000 | ORAL_STRIP | Freq: Every day | Status: DC

## 2015-10-27 NOTE — Telephone Encounter (Signed)
done

## 2015-10-27 NOTE — Telephone Encounter (Signed)
Refill request for Contour Blood Gluc Test Str #100

## 2015-10-27 NOTE — Telephone Encounter (Signed)
Ok

## 2015-10-28 DIAGNOSIS — R69 Illness, unspecified: Secondary | ICD-10-CM | POA: Diagnosis not present

## 2015-11-07 DIAGNOSIS — R69 Illness, unspecified: Secondary | ICD-10-CM | POA: Diagnosis not present

## 2015-12-02 ENCOUNTER — Ambulatory Visit (INDEPENDENT_AMBULATORY_CARE_PROVIDER_SITE_OTHER): Payer: Medicare HMO | Admitting: Family Medicine

## 2015-12-02 ENCOUNTER — Encounter: Payer: Self-pay | Admitting: Family Medicine

## 2015-12-02 VITALS — BP 170/78 | HR 66 | Ht 68.0 in | Wt 187.0 lb

## 2015-12-02 DIAGNOSIS — N529 Male erectile dysfunction, unspecified: Secondary | ICD-10-CM

## 2015-12-02 DIAGNOSIS — E118 Type 2 diabetes mellitus with unspecified complications: Secondary | ICD-10-CM | POA: Diagnosis not present

## 2015-12-02 DIAGNOSIS — E1169 Type 2 diabetes mellitus with other specified complication: Secondary | ICD-10-CM | POA: Diagnosis not present

## 2015-12-02 DIAGNOSIS — E119 Type 2 diabetes mellitus without complications: Secondary | ICD-10-CM | POA: Diagnosis not present

## 2015-12-02 DIAGNOSIS — I152 Hypertension secondary to endocrine disorders: Secondary | ICD-10-CM

## 2015-12-02 DIAGNOSIS — E785 Hyperlipidemia, unspecified: Secondary | ICD-10-CM

## 2015-12-02 DIAGNOSIS — Z23 Encounter for immunization: Secondary | ICD-10-CM | POA: Diagnosis not present

## 2015-12-02 DIAGNOSIS — R972 Elevated prostate specific antigen [PSA]: Secondary | ICD-10-CM | POA: Diagnosis not present

## 2015-12-02 DIAGNOSIS — N400 Enlarged prostate without lower urinary tract symptoms: Secondary | ICD-10-CM | POA: Diagnosis not present

## 2015-12-02 DIAGNOSIS — I10 Essential (primary) hypertension: Secondary | ICD-10-CM

## 2015-12-02 DIAGNOSIS — E1159 Type 2 diabetes mellitus with other circulatory complications: Secondary | ICD-10-CM

## 2015-12-02 LAB — POCT GLYCOSYLATED HEMOGLOBIN (HGB A1C): HEMOGLOBIN A1C: 6.7

## 2015-12-02 NOTE — Progress Notes (Signed)
  Subjective:    Patient ID: YOSGART MAYALL, male    DOB: 01/21/1941, 75 y.o.   MRN: [id number]  FORSTER PAMER is a 75 y.o. male who presents for follow-up of Type 2 diabetes mellitus.  Patient is checking home blood sugars.   Home blood sugar records: BGs range between 116 and 200Rarely at 200 How often is blood sugars being checked: daily Current symptoms/problems include none and have been unchanged. Daily foot checks: yes    Any foot concerns: no Last eye exam: 10/2015 Exercise: The patient does not participate in regular exercise at present. He does have underlying BPH as well as an elevated PSA. He is followed by urology. He seems to doing well in that regard. Otherwise he has no concerns or complaints. The following portions of the patient's history were reviewed and updated as appropriate: allergies, current medications, past medical history, past social history and problem list.  ROS as in subjective above.     Objective:    Physical Exam Alert and in no distress otherwise not examined.  Blood pressure 170/78, pulse 66, height 5\' 8"  (1.727 m), weight 187 lb (84.823 kg), SpO2 99 %.  Lab Review Diabetic Labs Latest Ref Rng 07/11/2015 07/08/2014 06/21/2013 12/07/2012 04/21/2012  HbA1c - 6.8 7.1 7.1 7.7 7.0  Chol 125 - 200 mg/dL 127 119 158 - 147  HDL >=40 mg/dL 49 44 66 - 52  Calc LDL <130 mg/dL 63 62 73 - 81  Triglycerides <150 mg/dL 74 63 95 - 69  Creatinine 0.70 - 1.18 mg/dL 0.73 0.68 0.81 - 0.93   BP/Weight 12/02/2015 07/11/2015 07/08/2014 01/09/2014 123456  Systolic BP 123XX123 123456 Q000111Q [id number] 0000000  Diastolic BP 78 50 80 90 72  Wt. (Lbs) 187 178 186 184 181  BMI 28.44 26.67 28.68 27.98 27.53   Foot/eye exam completion dates Latest Ref Rng 10/23/2015 07/08/2014  Eye Exam No Retinopathy No Retinopathy -  Foot Form Completion - - Done   A1C is 6.7  Villard  reports that he has quit smoking. He has never used smokeless tobacco. He reports that he does not drink alcohol or use illicit  drugs.     Assessment & Plan:    Diabetes mellitus without complication ([place]) - Plan: POCT glycosylated hemoglobin (Hb A1C)  Erectile dysfunction, unspecified erectile dysfunction type  BPH (benign prostatic hyperplasia)  Hyperlipidemia associated with type 2 diabetes mellitus ([place])  Hypertension associated with diabetes ([place])  Type 2 diabetes mellitus with complication, without long-term current use of insulin ([place])  Need for prophylactic vaccination and inoculation against influenza - Plan: Flu vaccine HIGH DOSE PF (Fluzone High dose)  Elevated PSA   1. Rx changes: none 2. Education: Reviewed 'ABCs' of diabetes management (respective goals in parentheses):  A1C (<7), blood pressure (<130/80), and cholesterol (LDL <100). 3. Compliance at present is estimated to be good. Efforts to improve compliance (if necessary) will be directed at increased exercise. 4. Follow up: 4 months  5. Blood pressure is slightly elevated but review of previous pressures was adequate. We will continue to follow this.

## 2016-03-30 ENCOUNTER — Ambulatory Visit (INDEPENDENT_AMBULATORY_CARE_PROVIDER_SITE_OTHER): Payer: Medicare HMO | Admitting: Family Medicine

## 2016-03-30 ENCOUNTER — Encounter: Payer: Self-pay | Admitting: Family Medicine

## 2016-03-30 VITALS — BP 126/70 | HR 80 | Ht 68.0 in | Wt 184.0 lb

## 2016-03-30 DIAGNOSIS — J302 Other seasonal allergic rhinitis: Secondary | ICD-10-CM | POA: Diagnosis not present

## 2016-03-30 DIAGNOSIS — I10 Essential (primary) hypertension: Secondary | ICD-10-CM

## 2016-03-30 DIAGNOSIS — N400 Enlarged prostate without lower urinary tract symptoms: Secondary | ICD-10-CM | POA: Diagnosis not present

## 2016-03-30 DIAGNOSIS — D692 Other nonthrombocytopenic purpura: Secondary | ICD-10-CM | POA: Diagnosis not present

## 2016-03-30 DIAGNOSIS — E785 Hyperlipidemia, unspecified: Secondary | ICD-10-CM

## 2016-03-30 DIAGNOSIS — I152 Hypertension secondary to endocrine disorders: Secondary | ICD-10-CM

## 2016-03-30 DIAGNOSIS — E118 Type 2 diabetes mellitus with unspecified complications: Secondary | ICD-10-CM

## 2016-03-30 DIAGNOSIS — R972 Elevated prostate specific antigen [PSA]: Secondary | ICD-10-CM | POA: Diagnosis not present

## 2016-03-30 DIAGNOSIS — E1159 Type 2 diabetes mellitus with other circulatory complications: Secondary | ICD-10-CM | POA: Diagnosis not present

## 2016-03-30 DIAGNOSIS — E1169 Type 2 diabetes mellitus with other specified complication: Secondary | ICD-10-CM | POA: Diagnosis not present

## 2016-03-30 LAB — POCT GLYCOSYLATED HEMOGLOBIN (HGB A1C): HEMOGLOBIN A1C: 7.9

## 2016-03-30 MED ORDER — METFORMIN HCL 500 MG PO TABS: 500.0000 mg | ORAL_TABLET | Freq: Two times a day (BID) | ORAL | Status: DC

## 2016-03-30 NOTE — Progress Notes (Signed)
  Subjective:    Patient ID: Maxwell Sanders, male    DOB: [birth year], 75 y.o.   MRN: [id number]  Maxwell Sanders is a 75 y.o. male who presents for follow-up of Type 2 diabetes mellitus.  Patient is checking home blood sugars.   Home blood sugar records: high 174 low 130 How often is blood sugars being checked: couple days a week Current symptoms/problems none Daily foot checks: yes   Any foot concerns: none Last eye exam: 10/23/15 Exercise: walking some going all day from 4 am  his allergies seem to be under good control. He does have a previous history of elevated PSA with biopsy review was seen last year with a PSA that was slightly lower. He also has a history of BPH. The following portions of the patient's history were reviewed and updated as appropriate: allergies, current medications, past medical history, past social history and problem list.  ROS as in subjective above.     Objective:    Physical Exam Alert and in no distress  Purpuric lesions noted on the forearms.   Lab Review Diabetic Labs Latest Ref Rng 12/02/2015 07/11/2015 07/08/2014 06/21/2013 12/07/2012  HbA1c - 6.7 6.8 7.1 7.1 7.7  Chol 125 - 200 mg/dL - 127 119 158 -  HDL >=40 mg/dL - 49 44 66 -  Calc LDL <130 mg/dL - 63 62 73 -  Triglycerides <150 mg/dL - 74 63 95 -  Creatinine 0.70 - 1.18 mg/dL - 0.73 0.68 0.81 -   BP/Weight 12/02/2015 07/11/2015 07/08/2014 01/09/2014 123456  Systolic BP 123XX123 123456 Q000111Q Q000111Q 0000000  Diastolic BP 78 50 80 90 72  Wt. (Lbs) 187 178 186 184 181  BMI 28.44 26.67 28.68 27.98 27.53   Foot/eye exam completion dates Latest Ref Rng 10/23/2015 07/08/2014  Eye Exam No Retinopathy No Retinopathy -  Foot Form Completion - - Done   hemoglobin A1c is 7.9  Amorion  reports that he has quit smoking. He has never used smokeless tobacco. He reports that he does not drink alcohol or use illicit drugs.     Assessment & Plan:    Senile purpura ([place])  Hyperlipidemia associated with type 2 diabetes mellitus  ([place])  Hypertension associated with diabetes ([place])  Type 2 diabetes mellitus with complication, without long-term current use of insulin ([place]) - Plan: POCT glycosylated hemoglobin (Hb A1C), metFORMIN (GLUCOPHAGE) 500 MG tablet, ABI  Allergic rhinitis, seasonal  Elevated PSA  BPH (benign prostatic hyperplasia)   1. Rx changes:  metformin 500 twice a day I explained that since his hemoglobin A1c is going up, we need to be more aggressive with treating the diabetes. 2. Education: Reviewed 'ABCs' of diabetes management (respective goals in parentheses):  A1C (<7), blood pressure (<130/80), and cholesterol (LDL <100). 3. Compliance at present is estimated to be fair. Efforts to improve compliance (if necessary) will be directed at increased exercise. 4. Follow up: 4 months  5.  he seems to be fairly stable in regard to his PSA.

## 2016-07-15 ENCOUNTER — Ambulatory Visit: Payer: Medicare HMO | Admitting: Family Medicine

## 2016-07-20 DIAGNOSIS — R69 Illness, unspecified: Secondary | ICD-10-CM | POA: Diagnosis not present

## 2016-07-22 ENCOUNTER — Ambulatory Visit (INDEPENDENT_AMBULATORY_CARE_PROVIDER_SITE_OTHER): Payer: Medicare HMO | Admitting: Family Medicine

## 2016-07-22 ENCOUNTER — Encounter: Payer: Self-pay | Admitting: Family Medicine

## 2016-07-22 VITALS — BP 120/68 | HR 70 | Ht 68.0 in | Wt 182.0 lb

## 2016-07-22 DIAGNOSIS — E1159 Type 2 diabetes mellitus with other circulatory complications: Secondary | ICD-10-CM | POA: Diagnosis not present

## 2016-07-22 DIAGNOSIS — Z23 Encounter for immunization: Secondary | ICD-10-CM | POA: Diagnosis not present

## 2016-07-22 DIAGNOSIS — I152 Hypertension secondary to endocrine disorders: Secondary | ICD-10-CM

## 2016-07-22 DIAGNOSIS — E118 Type 2 diabetes mellitus with unspecified complications: Secondary | ICD-10-CM | POA: Diagnosis not present

## 2016-07-22 DIAGNOSIS — I10 Essential (primary) hypertension: Secondary | ICD-10-CM

## 2016-07-22 DIAGNOSIS — J302 Other seasonal allergic rhinitis: Secondary | ICD-10-CM | POA: Diagnosis not present

## 2016-07-22 DIAGNOSIS — C4440 Unspecified malignant neoplasm of skin of scalp and neck: Secondary | ICD-10-CM

## 2016-07-22 DIAGNOSIS — R972 Elevated prostate specific antigen [PSA]: Secondary | ICD-10-CM

## 2016-07-22 DIAGNOSIS — E1169 Type 2 diabetes mellitus with other specified complication: Secondary | ICD-10-CM | POA: Diagnosis not present

## 2016-07-22 DIAGNOSIS — E785 Hyperlipidemia, unspecified: Secondary | ICD-10-CM

## 2016-07-22 DIAGNOSIS — D692 Other nonthrombocytopenic purpura: Secondary | ICD-10-CM

## 2016-07-22 LAB — CBC WITH DIFFERENTIAL/PLATELET
BASOS ABS: 101 {cells}/uL (ref 0–200)
Basophils Relative: 1 %
EOS ABS: 202 {cells}/uL (ref 15–500)
Eosinophils Relative: 2 %
HEMATOCRIT: 43.6 % (ref 38.5–50.0)
HEMOGLOBIN: 14.9 g/dL (ref 13.2–17.1)
LYMPHS PCT: 18 %
Lymphs Abs: 1818 cells/uL (ref 850–3900)
MCH: 32.5 pg (ref 27.0–33.0)
MCHC: 34.2 g/dL (ref 32.0–36.0)
MCV: 95.2 fL (ref 80.0–100.0)
MONO ABS: 808 {cells}/uL (ref 200–950)
MPV: 10 fL (ref 7.5–12.5)
Monocytes Relative: 8 %
NEUTROS PCT: 71 %
Neutro Abs: 7171 cells/uL (ref 1500–7800)
Platelets: 261 10*3/uL (ref 140–400)
RBC: 4.58 MIL/uL (ref 4.20–5.80)
RDW: 12.8 % (ref 11.0–15.0)
WBC: 10.1 10*3/uL (ref 4.0–10.5)

## 2016-07-22 LAB — COMPREHENSIVE METABOLIC PANEL
ALBUMIN: 4.4 g/dL (ref 3.6–5.1)
ALK PHOS: 61 U/L (ref 40–115)
ALT: 19 U/L (ref 9–46)
AST: 22 U/L (ref 10–35)
BUN: 14 mg/dL (ref 7–25)
CALCIUM: 9.8 mg/dL (ref 8.6–10.3)
CO2: 25 mmol/L (ref 20–31)
Chloride: 100 mmol/L (ref 98–110)
Creat: 0.76 mg/dL (ref 0.70–1.18)
GLUCOSE: 151 mg/dL — AB (ref 65–99)
POTASSIUM: 4.3 mmol/L (ref 3.5–5.3)
Sodium: 138 mmol/L (ref 135–146)
Total Bilirubin: 0.7 mg/dL (ref 0.2–1.2)
Total Protein: 7.2 g/dL (ref 6.1–8.1)

## 2016-07-22 LAB — LIPID PANEL
CHOL/HDL RATIO: 2.6 ratio (ref <=5.0)
Cholesterol: 148 mg/dL (ref 125–200)
HDL: 56 mg/dL (ref >=40)
LDL Cholesterol: 77 mg/dL (ref <130)
TRIGLYCERIDES: 76 mg/dL (ref <150)
VLDL: 15 mg/dL (ref <30)

## 2016-07-22 LAB — POCT UA - MICROALBUMIN
Albumin/Creatinine Ratio, Urine, POC: <17.7
Creatinine, POC: 28.2 mg/dL
Microalbumin Ur, POC: <5 mg/L

## 2016-07-22 LAB — POCT GLYCOSYLATED HEMOGLOBIN (HGB A1C): Hemoglobin A1C: 7.1

## 2016-07-22 MED ORDER — SIMVASTATIN 20 MG PO TABS: 20.0000 mg | ORAL_TABLET | Freq: Every day | ORAL | 3 refills | Status: DC

## 2016-07-22 MED ORDER — METOPROLOL TARTRATE 50 MG PO TABS: 50.0000 mg | ORAL_TABLET | Freq: Two times a day (BID) | ORAL | 3 refills | Status: DC

## 2016-07-22 MED ORDER — LISINOPRIL-HYDROCHLOROTHIAZIDE 20-12.5 MG PO TABS: 1.0000 | ORAL_TABLET | Freq: Every day | ORAL | 3 refills | Status: DC

## 2016-07-22 NOTE — Progress Notes (Signed)
Subjective:   HPI  Maxwell Sanders is a 75 y.o. male who presents for a complete physical.  Medical care team includes:  Dr.lupton derm.  Dr.reves dent  Dr.[facility]  Dr.grappy uro.   Preventative care: Last ophthalmology visit:10/23/15 Last dental visit:07/20/16 Last colonoscopy:08/05/15 Last prostate exam:2016 Last EKG:07/08/14 Last labs:07/11/15  Prior vaccinations: TD or Tdap:04/21/12 Influenza:12/02/15 Pneumococcal:23: 05/11/06  13: 06/21/13 Shingles/Zostavax:03/14/08 Advanced directive:Yes Health care power of attorney: Yes Living will: Yes Concerns: He does have a lesion on the left temporal area that he plans to see a dermatologist about. He continues on metformin. He does check his blood sugars usually several times per week and they run usually below 140. He was started on metformin on his last visit. He is having no difficulty from that medicine. He is having no problems with his eyes and had his last eye exam in December of last year. He does check his feet periodically. He continues on lisinopril and has had no difficulties from that. He also takes metoprolol and again having no problems. Simvastatin is not causing aches and pains. He does occasionally use Cialis and notes an improvement in his BPH symptoms. He is followed for his elevated PSA by Dr. Bunnie Philips. He recently did have a Cologuard which was negative. He had an ABI in 2015. He does have seasonal allergies and presently is having no trouble.  Reviewed their medical, surgical, family, social, medication, and allergy history and updated chart as appropriate.    Review of Systems Constitutional: -fever, -chills, -sweats, -unexpected weight change, - Allergy: -sneezing, -itching, -congestion Dermatology: -changing moles, --rash, -lumps ENT: -runny nose, -ear pain, -sore throat, -hoarseness, -sinus pain, -teeth pain, - ringing in ears, -hearing loss, -nosebleeds Cardiology: -chest pain, -palpitations, -swelling,  -difficulty breathing when lying flat, -waking up short of breath Respiratory: -cough, -shortness of breath, -difficulty breathing with exercise or exertion, -wheezing, -coughing up blood Gastroenterology: -abdominal pain, -nausea, -vomiting, -diarrhea, -constipation, -blood in stool, -changes in bowel movement, -difficulty swallowing or eating Hematology: -bleeding, -bruising  Musculoskeletal: -joint aches, -muscle aches, -joint swelling, -back pain, -neck pain, -cramping, -changes in gait Ophthalmology: denies vision changes, eye redness, itching, discharge Urology: -burning with urination, -difficulty urinating, -blood in urine, -urinary frequency, -urgency, -incontinence Neurology: -headache, -weakness, -tingling, -numbness, -memory loss, -falls, -dizziness Psychology: -depressed mood, -agitation, -sleep problems     Objective:   Physical Exam   General appearance: alert, no distress, WD/WN,  Skin: Purpuric lesions on his forearms have essentially diminished. HEENT: normocephalic, conjunctiva/corneas normal, sclerae anicteric, PERRLA, EOMi, nares patent, no discharge or erythema, pharynx normal. One and half centimeter round raised lesion with central scabbing noted in the left parietal area. Oral cavity: MMM, tongue normal, teeth normal Neck: supple, no lymphadenopathy, no thyromegaly, no masses, normal ROM Chest: non tender, normal shape and expansion Heart: RRR, normal S1, S2, no murmurs Lungs: CTA bilaterally, no wheezes, rhonchi, or rales Abdomen: +bs, soft, non tender, non distended, no masses, no hepatomegaly, no splenomegaly, no bruits Back: non tender, normal ROM, no scoliosis Musculoskeletal: upper extremities non tender, no obvious deformity, normal ROM throughout, lower extremities non tender, no obvious deformity, normal ROM throughout Extremities: no edema, no cyanosis, no clubbing Pulses: 2+ symmetric, upper and lower extremities, normal cap refill Neurological: alert,  oriented x 3, CN2-12 intact, strength normal upper extremities and lower extremities, sensation normal throughout, DTRs 2+ throughout, no cerebellar signs, gait normal Psychiatric: normal affect, behavior normal, pleasant  Hemoglobin A1c is 7.1  Assessment and Plan :  Type 2 diabetes mellitus with complication, without long-term current use of insulin ([facility]) - Plan: POCT UA - Microalbumin, HgB A1c, CBC with Differential/Platelet, Comprehensive metabolic panel, Lipid panel  Skin cancer of scalp  Senile purpura ([facility])  Hyperlipidemia associated with type 2 diabetes mellitus ([place]) - Plan: Lipid panel, simvastatin (ZOCOR) 20 MG tablet  Hypertension associated with diabetes ([place]) - Plan: CBC with Differential/Platelet, Comprehensive metabolic panel, lisinopril-hydrochlorothiazide (PRINZIDE,ZESTORETIC) 20-12.5 MG tablet, metoprolol (LOPRESSOR) 50 MG tablet  Allergic rhinitis, seasonal  Elevated PSA  Need for prophylactic vaccination and inoculation against influenza - Plan: Flu vaccine HIGH DOSE PF (Fluzone High dose) Complemented him on his diabetes under adequate control. Encouraged him to continue with his present lifestyle and continue on medications. Follow-up in 4 months.  Physical exam - discussed healthy lifestyle, diet, exercise, preventative care, vaccinations, and addressed his concerns.   He has an appointment set up with dermatology for further evaluation and treatment of the scalp lesion. Follow-up  4 monnths

## 2016-07-23 DIAGNOSIS — N401 Enlarged prostate with lower urinary tract symptoms: Secondary | ICD-10-CM | POA: Diagnosis not present

## 2016-07-23 DIAGNOSIS — R972 Elevated prostate specific antigen [PSA]: Secondary | ICD-10-CM | POA: Diagnosis not present

## 2016-07-23 DIAGNOSIS — R351 Nocturia: Secondary | ICD-10-CM | POA: Diagnosis not present

## 2016-08-02 DIAGNOSIS — C4442 Squamous cell carcinoma of skin of scalp and neck: Secondary | ICD-10-CM | POA: Diagnosis not present

## 2016-08-02 DIAGNOSIS — L570 Actinic keratosis: Secondary | ICD-10-CM | POA: Diagnosis not present

## 2016-08-02 DIAGNOSIS — D485 Neoplasm of uncertain behavior of skin: Secondary | ICD-10-CM | POA: Diagnosis not present

## 2016-08-02 DIAGNOSIS — L821 Other seborrheic keratosis: Secondary | ICD-10-CM | POA: Diagnosis not present

## 2016-08-02 DIAGNOSIS — D692 Other nonthrombocytopenic purpura: Secondary | ICD-10-CM | POA: Diagnosis not present

## 2016-08-04 ENCOUNTER — Other Ambulatory Visit (HOSPITAL_COMMUNITY): Payer: Self-pay | Admitting: Urology

## 2016-08-04 DIAGNOSIS — R972 Elevated prostate specific antigen [PSA]: Secondary | ICD-10-CM

## 2016-08-11 DIAGNOSIS — C4442 Squamous cell carcinoma of skin of scalp and neck: Secondary | ICD-10-CM | POA: Diagnosis not present

## 2016-08-11 DIAGNOSIS — Z85828 Personal history of other malignant neoplasm of skin: Secondary | ICD-10-CM | POA: Diagnosis not present

## 2016-08-25 ENCOUNTER — Ambulatory Visit (HOSPITAL_COMMUNITY): Payer: Medicare HMO

## 2016-08-25 ENCOUNTER — Encounter: Payer: Self-pay | Admitting: Family Medicine

## 2016-08-26 ENCOUNTER — Ambulatory Visit (HOSPITAL_COMMUNITY)
Admission: RE | Admit: 2016-08-26 | Discharge: 2016-08-26 | Disposition: A | Discharge status: Home / Self Care | Payer: Medicare HMO | Source: Ambulatory Visit | Attending: Urology | Admitting: Urology

## 2016-08-26 DIAGNOSIS — N400 Enlarged prostate without lower urinary tract symptoms: Secondary | ICD-10-CM | POA: Diagnosis not present

## 2016-08-26 DIAGNOSIS — K5730 Diverticulosis of large intestine without perforation or abscess without bleeding: Secondary | ICD-10-CM | POA: Insufficient documentation

## 2016-08-26 DIAGNOSIS — R972 Elevated prostate specific antigen [PSA]: Secondary | ICD-10-CM | POA: Diagnosis not present

## 2016-08-26 DIAGNOSIS — N433 Hydrocele, unspecified: Secondary | ICD-10-CM | POA: Diagnosis not present

## 2016-08-26 LAB — POCT I-STAT CREATININE: Creatinine, Ser: 0.7 mg/dL (ref 0.61–1.24)

## 2016-09-23 DIAGNOSIS — Z961 Presence of intraocular lens: Secondary | ICD-10-CM | POA: Diagnosis not present

## 2016-09-23 DIAGNOSIS — H10413 Chronic giant papillary conjunctivitis, bilateral: Secondary | ICD-10-CM | POA: Diagnosis not present

## 2016-09-23 DIAGNOSIS — H11003 Unspecified pterygium of eye, bilateral: Secondary | ICD-10-CM | POA: Diagnosis not present

## 2016-09-23 DIAGNOSIS — E119 Type 2 diabetes mellitus without complications: Secondary | ICD-10-CM | POA: Diagnosis not present

## 2016-09-23 LAB — HM DIABETES EYE EXAM

## 2016-09-28 ENCOUNTER — Other Ambulatory Visit: Payer: Self-pay | Admitting: Family Medicine

## 2016-09-28 DIAGNOSIS — R69 Illness, unspecified: Secondary | ICD-10-CM | POA: Diagnosis not present

## 2016-09-28 DIAGNOSIS — E118 Type 2 diabetes mellitus with unspecified complications: Secondary | ICD-10-CM

## 2016-09-29 DIAGNOSIS — R69 Illness, unspecified: Secondary | ICD-10-CM | POA: Diagnosis not present

## 2016-11-02 ENCOUNTER — Encounter: Payer: Self-pay | Admitting: Family Medicine

## 2016-11-04 DIAGNOSIS — R972 Elevated prostate specific antigen [PSA]: Secondary | ICD-10-CM | POA: Diagnosis not present

## 2016-11-08 ENCOUNTER — Encounter: Payer: Self-pay | Admitting: Family Medicine

## 2016-11-08 ENCOUNTER — Ambulatory Visit (INDEPENDENT_AMBULATORY_CARE_PROVIDER_SITE_OTHER): Payer: Medicare HMO | Admitting: Family Medicine

## 2016-11-08 VITALS — BP 130/60 | HR 78 | Wt 184.0 lb

## 2016-11-08 DIAGNOSIS — E118 Type 2 diabetes mellitus with unspecified complications: Secondary | ICD-10-CM | POA: Diagnosis not present

## 2016-11-08 DIAGNOSIS — R972 Elevated prostate specific antigen [PSA]: Secondary | ICD-10-CM

## 2016-11-08 LAB — POCT GLYCOSYLATED HEMOGLOBIN (HGB A1C): Hemoglobin A1C: 7.3

## 2016-11-08 NOTE — Progress Notes (Signed)
   Subjective:    Patient ID: MACKAY TEUFEL, male    DOB: 21-Aug-1941, 75 y.o.   MRN: [id number]  HPI He is here for consult concerning elevated blood sugars. He recently had a urologic procedure and noted shortly after that that his blood sugars did go up. He did have biopsies done on his prostate. The numbers are in the low 200 range.   Review of Systems     Objective:   Physical Exam [place] and in no distress otherwise not examined       Assessment & Plan:  Type 2 diabetes mellitus with complication, without long-term current use of insulin ([place]) - Plan: HgB A1c  Elevated PSA Go over the numbers with him and explained that this was probably secondary to his physiologic stress. He is to keep track of his blood sugars and if they remain in the 200 range, we will readjust his medication.

## 2016-11-08 NOTE — Patient Instructions (Signed)
If your blood sugar goes up and stays up like it is now for the next couple weeks, come back

## 2017-02-10 DIAGNOSIS — D485 Neoplasm of uncertain behavior of skin: Secondary | ICD-10-CM | POA: Diagnosis not present

## 2017-02-10 DIAGNOSIS — L821 Other seborrheic keratosis: Secondary | ICD-10-CM | POA: Diagnosis not present

## 2017-02-10 DIAGNOSIS — D692 Other nonthrombocytopenic purpura: Secondary | ICD-10-CM | POA: Diagnosis not present

## 2017-02-10 DIAGNOSIS — L720 Epidermal cyst: Secondary | ICD-10-CM | POA: Diagnosis not present

## 2017-02-10 DIAGNOSIS — C4442 Squamous cell carcinoma of skin of scalp and neck: Secondary | ICD-10-CM

## 2017-02-10 DIAGNOSIS — Z85828 Personal history of other malignant neoplasm of skin: Secondary | ICD-10-CM | POA: Diagnosis not present

## 2017-02-10 DIAGNOSIS — L570 Actinic keratosis: Secondary | ICD-10-CM | POA: Diagnosis not present

## 2017-02-10 HISTORY — DX: Squamous cell carcinoma of skin of scalp and neck: C44.42

## 2017-02-24 ENCOUNTER — Encounter: Payer: Self-pay | Admitting: Family Medicine

## 2017-03-10 ENCOUNTER — Ambulatory Visit (INDEPENDENT_AMBULATORY_CARE_PROVIDER_SITE_OTHER): Payer: Medicare HMO | Admitting: Family Medicine

## 2017-03-10 ENCOUNTER — Encounter: Payer: Self-pay | Admitting: Family Medicine

## 2017-03-10 VITALS — BP 110/70 | HR 60 | Ht 68.0 in | Wt 182.0 lb

## 2017-03-10 DIAGNOSIS — J302 Other seasonal allergic rhinitis: Secondary | ICD-10-CM | POA: Diagnosis not present

## 2017-03-10 DIAGNOSIS — C4440 Unspecified malignant neoplasm of skin of scalp and neck: Secondary | ICD-10-CM

## 2017-03-10 DIAGNOSIS — E118 Type 2 diabetes mellitus with unspecified complications: Secondary | ICD-10-CM

## 2017-03-10 DIAGNOSIS — N401 Enlarged prostate with lower urinary tract symptoms: Secondary | ICD-10-CM

## 2017-03-10 DIAGNOSIS — E1159 Type 2 diabetes mellitus with other circulatory complications: Secondary | ICD-10-CM

## 2017-03-10 DIAGNOSIS — I10 Essential (primary) hypertension: Secondary | ICD-10-CM | POA: Diagnosis not present

## 2017-03-10 DIAGNOSIS — E785 Hyperlipidemia, unspecified: Secondary | ICD-10-CM

## 2017-03-10 DIAGNOSIS — D692 Other nonthrombocytopenic purpura: Secondary | ICD-10-CM | POA: Diagnosis not present

## 2017-03-10 DIAGNOSIS — E1169 Type 2 diabetes mellitus with other specified complication: Secondary | ICD-10-CM | POA: Diagnosis not present

## 2017-03-10 DIAGNOSIS — N529 Male erectile dysfunction, unspecified: Secondary | ICD-10-CM

## 2017-03-10 DIAGNOSIS — I152 Hypertension secondary to endocrine disorders: Secondary | ICD-10-CM

## 2017-03-10 LAB — POCT GLYCOSYLATED HEMOGLOBIN (HGB A1C): Hemoglobin A1C: 7

## 2017-03-10 NOTE — Progress Notes (Signed)
Subjective:     Patient ID: Maxwell Sanders, male   DOB: 1941/01/14, 76 y.o.   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  He takes his blood sugars twice a week. The blood sugars range from 120-160.  He has had no symptoms from his diabetes. He denies lightheadedness, increased thirst/urination, and numbness in feet. His diet is normal and unchanged. He doesn't exercise except for daily activity around the house He takes metformin every day as prescribed and has no symptoms or concerns about this medication. He checks his feet daily. His last opthalmology exam was in November 2017. He has an appointment in November of this year. He doesn't use tobacco, drink alcohol, or use any recreational drugs  He has hypertension and is taking lisinopril-HCTZ and metoprolol as prescribed. He has no concerns about these medications. He doesn't take his blood pressure at home. He hasn't had any headaches or lightheadedness.  He has hyperlipidemia and reports that he takes simvastatin daily for this. He has no concerns regarding his medication.  The patient has senile purpura. He doesn't have question or concerns regarding this. 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 He recently had it removed and is being followed by his Dermatologist, Dr. Ronnald Ramp.  He also takes cialis for his erectile dysfunction and he doesn't have any concerns or questions regarding this.   He has a history of allergic rhinitis, but Maxwell Sanders reports he doesn't take any medications for this and doesn't have any concerns or symptoms associated with allergies.  He also has a history of BPH. He hasn't had any symptoms, concerns, or issues with  this.  His family and social history was reviewed. There have been no changes. He does have a history of colon cancer in his mother, but reports he has had a colonoscopy in the past. His immunizations and screenings were reviewed and are up to date except for the shingrix vaccine and a diabetic foot exam. He reports he does have an advance directive. I encouraged him to submit a copy to the clinic to be included in the chart.  Review of Systems Pertinent positives and negatives included in the subjective above    Objective:   Physical Exam Alert and in no distress. Heart was regular rate and rhythm without murmurs, rubs, or gallops. Lungs were clear to auscultation bilaterally.  Feet had good pulses bilaterally. There were no wounds or scrapes. Sensation was good in both feet with 8/8 microfilament testing. Skin showed healing wounds on his head from the [facility] excision. Purpuric lesions were present on upper extremities bilaterally with the left arm around the elbow worse than the right.  His A1c is 7.0 today. Last time it was checked in December it was 7.3.    Assessment and Plan:    Type 2 diabetes mellitus with complication, without long-term current use of insulin (HCC)  Skin cancer of scalp  Senile purpura (HCC)  Seasonal allergic rhinitis, unspecified trigger  Hypertension associated with diabetes ([place])  Hyperlipidemia associated with type 2 diabetes mellitus (HCC)  Erectile dysfunction, unspecified erectile dysfunction type  Benign prostatic hyperplasia with lower urinary tract symptoms, symptom details unspecified   1. Type 2 diabetes: A1c was 7.0 which  is decreased from 7.3 in December. It is well-controlled on his current regimen. Encouraged the patient to continue medications, eat a healthy diet, and increase his exercise. 2. Hypertension: Well-controlled on current regimen. Encouraged patient to continue the lisinopril-HCTZ and metoprolol. 3. Hyperlipidemia:  Well-controlled on current regimen. We will obtain labs in August when he comes back for his yearly physical. 4. SCC on head: This is being followed by his dermatologist (Dr. Ronnald Ramp).  5. BPH: Patient feels this is well-controlled. 6. ED: Patient feels this is well-controlled. 7. Immunizations/Screenings: Encouraged the patient to get the Shingrix vaccine at his pharmacy.  Patient was seen in conjunction with Dr. Redmond School. Maxwell Sanders, MS3

## 2017-03-22 ENCOUNTER — Other Ambulatory Visit: Payer: Self-pay | Admitting: Family Medicine

## 2017-03-22 DIAGNOSIS — E118 Type 2 diabetes mellitus with unspecified complications: Secondary | ICD-10-CM

## 2017-05-19 DIAGNOSIS — R69 Illness, unspecified: Secondary | ICD-10-CM | POA: Diagnosis not present

## 2017-05-24 DIAGNOSIS — R69 Illness, unspecified: Secondary | ICD-10-CM | POA: Diagnosis not present

## 2017-07-24 ENCOUNTER — Other Ambulatory Visit: Payer: Self-pay | Admitting: Family Medicine

## 2017-07-24 DIAGNOSIS — I10 Essential (primary) hypertension: Principal | ICD-10-CM

## 2017-07-24 DIAGNOSIS — E1159 Type 2 diabetes mellitus with other circulatory complications: Secondary | ICD-10-CM

## 2017-07-24 DIAGNOSIS — I152 Hypertension secondary to endocrine disorders: Secondary | ICD-10-CM

## 2017-07-27 ENCOUNTER — Encounter: Payer: Self-pay | Admitting: Family Medicine

## 2017-07-27 ENCOUNTER — Ambulatory Visit (INDEPENDENT_AMBULATORY_CARE_PROVIDER_SITE_OTHER): Payer: Medicare HMO | Admitting: Family Medicine

## 2017-07-27 VITALS — BP 140/90 | HR 76 | Resp 16 | Ht 68.0 in | Wt 175.2 lb

## 2017-07-27 DIAGNOSIS — E118 Type 2 diabetes mellitus with unspecified complications: Secondary | ICD-10-CM | POA: Diagnosis not present

## 2017-07-27 DIAGNOSIS — R972 Elevated prostate specific antigen [PSA]: Secondary | ICD-10-CM | POA: Diagnosis not present

## 2017-07-27 DIAGNOSIS — I10 Essential (primary) hypertension: Secondary | ICD-10-CM

## 2017-07-27 DIAGNOSIS — C4440 Unspecified malignant neoplasm of skin of scalp and neck: Secondary | ICD-10-CM | POA: Insufficient documentation

## 2017-07-27 DIAGNOSIS — Z23 Encounter for immunization: Secondary | ICD-10-CM | POA: Diagnosis not present

## 2017-07-27 DIAGNOSIS — D692 Other nonthrombocytopenic purpura: Secondary | ICD-10-CM | POA: Diagnosis not present

## 2017-07-27 DIAGNOSIS — N401 Enlarged prostate with lower urinary tract symptoms: Secondary | ICD-10-CM

## 2017-07-27 DIAGNOSIS — E1169 Type 2 diabetes mellitus with other specified complication: Secondary | ICD-10-CM

## 2017-07-27 DIAGNOSIS — E1159 Type 2 diabetes mellitus with other circulatory complications: Secondary | ICD-10-CM

## 2017-07-27 DIAGNOSIS — I152 Hypertension secondary to endocrine disorders: Secondary | ICD-10-CM

## 2017-07-27 DIAGNOSIS — N528 Other male erectile dysfunction: Secondary | ICD-10-CM

## 2017-07-27 DIAGNOSIS — E785 Hyperlipidemia, unspecified: Secondary | ICD-10-CM | POA: Diagnosis not present

## 2017-07-27 LAB — POCT GLYCOSYLATED HEMOGLOBIN (HGB A1C): Hemoglobin A1C: 7.1

## 2017-07-27 LAB — POCT UA - MICROALBUMIN
Albumin/Creatinine Ratio, Urine, POC: 12.6
Creatinine, POC: 59.5 mg/dL
Microalbumin Ur, POC: 7.5 mg/L

## 2017-07-27 MED ORDER — SIMVASTATIN 20 MG PO TABS: 20.0000 mg | ORAL_TABLET | Freq: Every day | ORAL | 3 refills | Status: DC

## 2017-07-27 MED ORDER — TADALAFIL 20 MG PO TABS: 10.0000 mg | ORAL_TABLET | ORAL | 0 refills | Status: DC | PRN

## 2017-07-27 MED ORDER — LISINOPRIL-HYDROCHLOROTHIAZIDE 20-12.5 MG PO TABS: 1.0000 | ORAL_TABLET | Freq: Every day | ORAL | 3 refills | Status: DC

## 2017-07-27 NOTE — Patient Instructions (Signed)
  [name] , Thank you for taking time to come for your Medicare Wellness Visit. I appreciate your ongoing commitment to your health goals. Please review the following plan we discussed and let me know if I can assist you in the future.   These are the goals we discussed: Goals    None      This is a list of the screening recommended for you and due dates:  Health Maintenance  Topic Date Due  . Flu Shot  06/22/2017  . Hemoglobin A1C  09/09/2017  . Eye exam for diabetics  09/23/2017  . Complete foot exam   03/10/2018  . Tetanus Vaccine  04/21/2022  . Pneumonia vaccines  Completed

## 2017-07-27 NOTE — Progress Notes (Signed)
Maxwell Sanders is a 76 y.o. male who presents for annual wellness visit and follow-up on chronic medical conditions.  He has the following concerns: He has recently had difficulty with left temporal type headache as well as occasional difficulty with stomach discomfort and nausea but states that at this time both of these have gone away completely. He has a history of BPH as well as elevated PSA and is seen by urology. Apparently biopsies were negative. He does have underlying ED and will like a refill on his Cialis. Continues on lisinopril/HCTZ as well as metoprolol risk blood pressure. He takes simvastatin and is having no aches or pains with that. He does check his blood sugars periodically. He has no difficulty with his feet. He is scheduled to see his eye doctor in the near future. Does see dermatology and did have a squamous cell cancer removed from his left temporal area.   Immunizations and Health Maintenance Immunization History  Administered Date(s) Administered  . DT 11/07/2002  . Influenza, High Dose Seasonal PF 12/02/2015, 07/22/2016  . Influenza, Seasonal, Injecte, Preservative Fre 12/07/2012  . Pneumococcal Conjugate-13 06/21/2013  . Pneumococcal Polysaccharide-23 05/11/2006  . Tdap 04/21/2012  . Zoster 03/14/2008   Health Maintenance Due  Topic Date Due  . INFLUENZA VACCINE  06/22/2017    Last colonoscopy: cologuard- 2016 Last PSA: 2014 Dentist: doesn't see Ophtho: Raquel James 09/2017 Exercise: walks and floor exercises   Other doctors caring for patient include: [place].   Advanced Directives:  Yes living will and advance directives    Depression screen:  See questionnaire below.     Depression screen [facility] 2/9 07/27/2017 07/22/2016 07/11/2015 07/08/2014 12/07/2012  Decreased Interest 0 0 0 0 1  Down, Depressed, Hopeless 0 0 0 0 1  PHQ - 2 Score 0 0 0 0 2    Fall Screen: See Questionaire below.   Fall Risk  07/27/2017  07/22/2016 07/11/2015 07/08/2014 12/07/2012  Falls in the past year? No No No No No    ADL screen:  See questionnaire below.  Functional Status Survey: Is the patient deaf or have difficulty hearing?: No Does the patient have difficulty seeing, even when wearing glasses/contacts?: No Does the patient have difficulty concentrating, remembering, or making decisions?: No Does the patient have difficulty walking or climbing stairs?: No Does the patient have difficulty dressing or bathing?: No Does the patient have difficulty doing errands alone such as visiting a doctor's office or shopping?: No   Review of Systems  Constitutional: -, -unexpected weight change, -anorexia, -fatigue Allergy: -sneezing, -itching, -congestion Dermatology: denies changing moles, rash, lumps ENT: -runny nose, -ear pain, -sore throat,  Cardiology:  -chest pain, -palpitations, -orthopnea, Respiratory: -cough, -shortness of breath, -dyspnea on exertion, -wheezing,  Gastroenterology: -abdominal pain, -nausea, -vomiting, -diarrhea, -constipation, -dysphagia Hematology: -bleeding or bruising problems Musculoskeletal: -arthralgias, -myalgias, -joint swelling, -back pain, - Ophthalmology: -vision changes,  Urology: -dysuria, -difficulty urinating,  -urinary frequency, -urgency, incontinence Neurology: -, -numbness, , -memory loss, -falls, -dizziness    PHYSICAL EXAM:  Ht 5\' 8"  (1.727 m)   Wt 175 lb 3.2 oz (79.5 kg)   BMI 26.64 kg/m   General Appearance: Alert, cooperative, no distress, appears stated age Head: Normocephalic, without obvious abnormality, atraumatic. Surgical scar noted in the left parietal area Eyes: PERRL, conjunctiva/corneas clear, EOM's intact, fundi benign Ears: Normal TM's and external ear canals Nose: Nares normal, mucosa normal, no drainage or sinus   tenderness Throat: Lips, mucosa, and tongue normal; teeth  and gums normal Neck: Supple, no lymphadenopathy, thyroid:no  enlargement/tenderness/nodules; no carotid bruit or JVD Lungs: Clear to auscultation bilaterally without wheezes, rales or ronchi; respirations unlabored Heart: Regular rate and rhythm, S1 and S2 normal, no murmur, rub or gallop Abdomen: Soft, non-tender, nondistended, normoactive bowel sounds, no masses, no hepatosplenomegaly Extremities: No clubbing, cyanosis or edema Pulses: 2+ and symmetric all extremities Skin: Purpuric lesions noted on both forearms.  Lymph nodes: Cervical, supraclavicular, and axillary nodes normal Neurologic: CNII-XII intact, normal strength, sensation and gait; reflexes 2+ and symmetric throughout   Psych: Normal mood, affect, hygiene and grooming  ASSESSMENT/PLAN: Hyperlipidemia associated with type 2 diabetes mellitus ([place]) - Plan: HgB A1c, POCT UA - Microalbumin  Need for influenza vaccination  Hypertension associated with diabetes ([place])  Other male erectile dysfunction  Type 2 diabetes mellitus with complication, without long-term current use of insulin (HCC)  Skin cancer of scalp  Elevated PSA  Benign prostatic hyperplasia with lower urinary tract symptoms, symptom details unspecified  Senile purpura ([place]) I encouraged him to continue to take good care of himself and stay on the present medication regimen. Discussed PSA testing with him in follow-up with urology. He does plan to do this. I did explain that because of his age, aggressive intervention would be discouraged. He is to continue with his diet and exercise. Prescription written for Cialis.   Medicare Attestation I have personally reviewed: The patient's medical and social history Their use of alcohol, tobacco or illicit drugs Their current medications and supplements The patient's functional ability including ADLs,fall risks, home safety risks, cognitive, and hearing and visual impairment Diet and physical activities Evidence for depression or mood disorders  The patient's weight,  height, and BMI have been recorded in the chart.  I have made referrals, counseling, and provided education to the patient based on review of the above and I have provided the patient with a written personalized care plan for preventive services.     Wyatt Haste, MD   07/27/2017

## 2017-07-28 LAB — LIPID PANEL
CHOL/HDL RATIO: 2.6 (calc) (ref <5.0)
CHOLESTEROL: 143 mg/dL (ref <200)
HDL: 55 mg/dL (ref >40)
LDL Cholesterol (Calc): 70 mg/dL (calc)
Non-HDL Cholesterol (Calc): 88 mg/dL (calc) (ref <130)
TRIGLYCERIDES: 92 mg/dL (ref <150)

## 2017-07-28 LAB — COMPREHENSIVE METABOLIC PANEL
AG Ratio: 2 (calc) (ref 1.0–2.5)
ALKALINE PHOSPHATASE (APISO): 51 U/L (ref 40–115)
ALT: 18 U/L (ref 9–46)
AST: 22 U/L (ref 10–35)
Albumin: 4.7 g/dL (ref 3.6–5.1)
BUN: 12 mg/dL (ref 7–25)
CO2: 28 mmol/L (ref 20–32)
CREATININE: 0.86 mg/dL (ref 0.70–1.18)
Calcium: 9.5 mg/dL (ref 8.6–10.3)
Chloride: 101 mmol/L (ref 98–110)
GLUCOSE: 134 mg/dL — AB (ref 65–99)
Globulin: 2.3 g/dL (calc) (ref 1.9–3.7)
Potassium: 4.2 mmol/L (ref 3.5–5.3)
Sodium: 138 mmol/L (ref 135–146)
Total Bilirubin: 0.9 mg/dL (ref 0.2–1.2)
Total Protein: 7 g/dL (ref 6.1–8.1)

## 2017-07-28 LAB — CBC WITH DIFFERENTIAL/PLATELET
BASOS ABS: 140 {cells}/uL (ref 0–200)
BASOS PCT: 1.3 %
EOS ABS: 65 {cells}/uL (ref 15–500)
Eosinophils Relative: 0.6 %
HCT: 42.9 % (ref 38.5–50.0)
Hemoglobin: 14.5 g/dL (ref 13.2–17.1)
Lymphs Abs: 2236 cells/uL (ref 850–3900)
MCH: 31.5 pg (ref 27.0–33.0)
MCHC: 33.8 g/dL (ref 32.0–36.0)
MCV: 93.3 fL (ref 80.0–100.0)
MONOS PCT: 8.7 %
MPV: 10.6 fL (ref 7.5–12.5)
NEUTROS PCT: 68.7 %
Neutro Abs: 7420 cells/uL (ref 1500–7800)
PLATELETS: 260 10*3/uL (ref 140–400)
RBC: 4.6 10*6/uL (ref 4.20–5.80)
RDW: 11.7 % (ref 11.0–15.0)
TOTAL LYMPHOCYTE: 20.7 %
WBC mixed population: 940 cells/uL (ref 200–950)
WBC: 10.8 10*3/uL (ref 3.8–10.8)

## 2017-08-03 DIAGNOSIS — N401 Enlarged prostate with lower urinary tract symptoms: Secondary | ICD-10-CM | POA: Diagnosis not present

## 2017-08-03 DIAGNOSIS — R3912 Poor urinary stream: Secondary | ICD-10-CM | POA: Diagnosis not present

## 2017-08-03 DIAGNOSIS — R972 Elevated prostate specific antigen [PSA]: Secondary | ICD-10-CM | POA: Diagnosis not present

## 2017-08-08 LAB — PSA: PSA: 5.7

## 2017-08-09 DIAGNOSIS — D485 Neoplasm of uncertain behavior of skin: Secondary | ICD-10-CM | POA: Diagnosis not present

## 2017-08-09 DIAGNOSIS — L812 Freckles: Secondary | ICD-10-CM | POA: Diagnosis not present

## 2017-08-09 DIAGNOSIS — L720 Epidermal cyst: Secondary | ICD-10-CM | POA: Diagnosis not present

## 2017-08-09 DIAGNOSIS — L570 Actinic keratosis: Secondary | ICD-10-CM | POA: Diagnosis not present

## 2017-08-09 DIAGNOSIS — D044 Carcinoma in situ of skin of scalp and neck: Secondary | ICD-10-CM | POA: Diagnosis not present

## 2017-08-09 DIAGNOSIS — L820 Inflamed seborrheic keratosis: Secondary | ICD-10-CM | POA: Diagnosis not present

## 2017-08-09 DIAGNOSIS — L821 Other seborrheic keratosis: Secondary | ICD-10-CM | POA: Diagnosis not present

## 2017-08-09 DIAGNOSIS — Z85828 Personal history of other malignant neoplasm of skin: Secondary | ICD-10-CM | POA: Diagnosis not present

## 2017-08-16 ENCOUNTER — Other Ambulatory Visit: Payer: Self-pay | Admitting: Family Medicine

## 2017-08-16 DIAGNOSIS — E1159 Type 2 diabetes mellitus with other circulatory complications: Secondary | ICD-10-CM

## 2017-08-16 DIAGNOSIS — I152 Hypertension secondary to endocrine disorders: Secondary | ICD-10-CM

## 2017-08-16 DIAGNOSIS — E785 Hyperlipidemia, unspecified: Principal | ICD-10-CM

## 2017-08-16 DIAGNOSIS — E1169 Type 2 diabetes mellitus with other specified complication: Secondary | ICD-10-CM

## 2017-08-16 DIAGNOSIS — I10 Essential (primary) hypertension: Secondary | ICD-10-CM

## 2017-08-16 NOTE — Telephone Encounter (Signed)
Looks like this was refilled on 07/27/17 # 90 [facility] and its requesting to send to another pharmacy.

## 2017-09-08 IMAGING — MR MR PROSTATE WO/W CM
23 of 56 series · 23 of 56 positions shown · IV contrast (multihance)
Comparison: None.

ADDENDUM:
The original report was by Dr. Hidetake Kiros. The following
addendum is by Dr. Hidetake Kiros:

The left apical lesion of concern has been processed in DynaCAD with
appropriate information generated. Of note, after editing the
prostate boundaries directly in 3D, prostate volume was calculated
at 114 cc. This is felt to be more accurate than the original
estimation of prostate volume.
CLINICAL DATA: Rising PSA levels.
EXAM:
MR PROSTATE WITHOUT AND WITH CONTRAST
TECHNIQUE: Multiplanar multisequence MRI images were obtained of the pelvis
centered about the prostate. Pre and post contrast images were
obtained.
CONTRAST:  17 cc MultiHance

[Series 3: bSSFP fat-sat · axial · 6.0mm · 0.86mm/px · 1 of 44 slices shown]
[im 1/44]
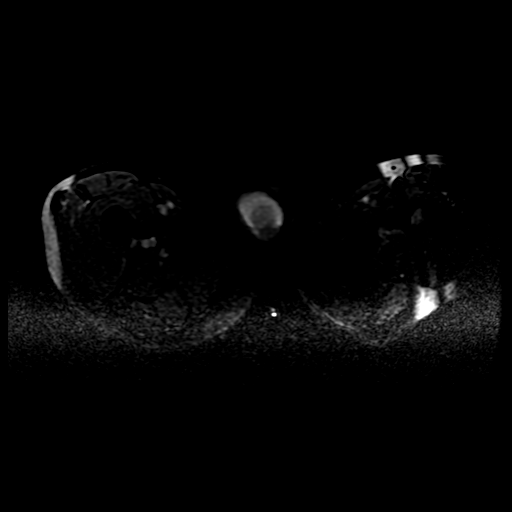

[Series 4: T1 · axial · 6.0mm · 0.86mm/px · 1 of 44 slices shown (1 of 2)]
[im 1/44]
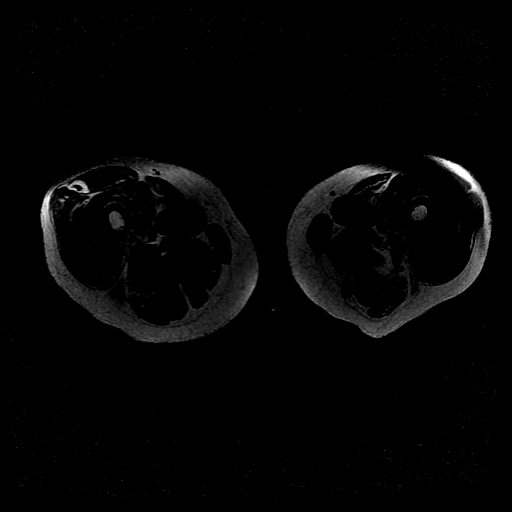

[Series 5: T2 · axial · 3.0mm · 0.29mm/px · 1 of 28 slices shown (1 of 5)]
[im 1/28]
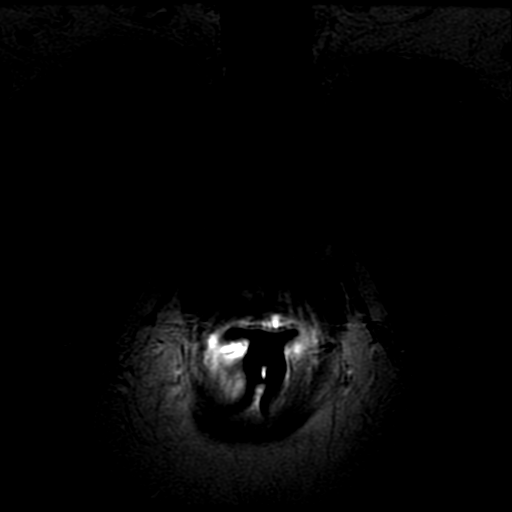

[Series 6: T1 · axial · 3.0mm · 0.29mm/px · 1 of 28 slices shown (2 of 2)]
[im 1/28]
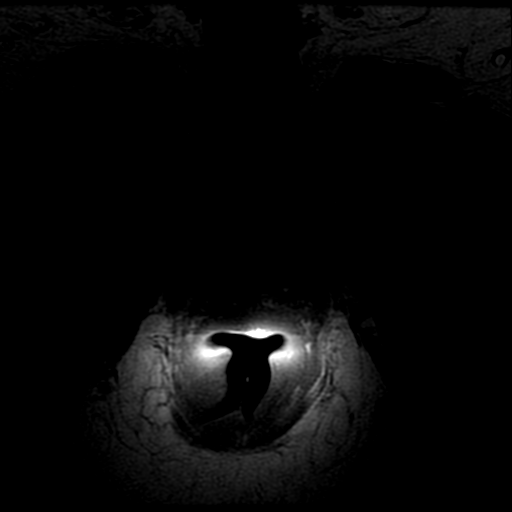

[Series 7: T2 · axial · 1.8mm · 0.47mm/px · 1 of 148 slices shown (2 of 5)]
[im 1/148]
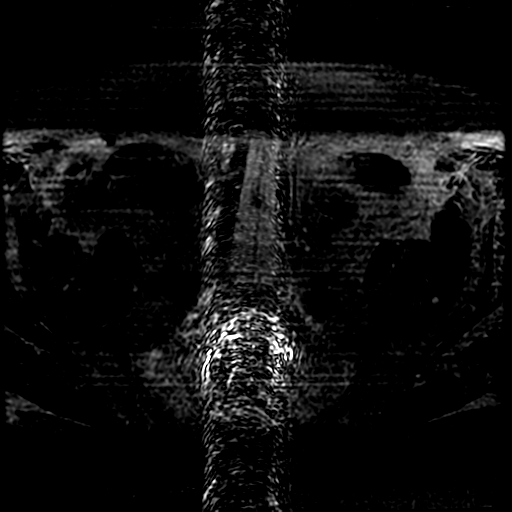

[Series 8: T2 · sagittal · 4.0mm · 0.29mm/px · 1 of 28 slices shown (3 of 5)]
[im 1/28]
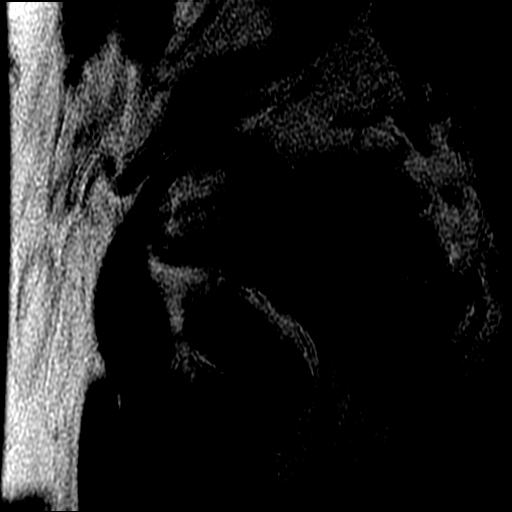

[Series 9: T2 · coronal · 4.0mm · 0.29mm/px · 1 of 26 slices shown (4 of 5)]
[im 1/26]
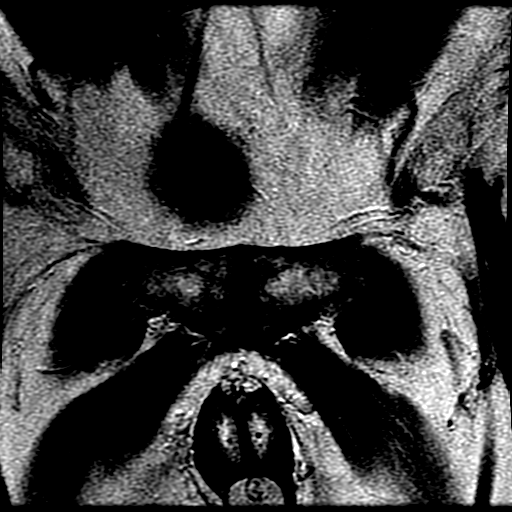

[Series 10: T2 · sagittal · 4.0mm · 0.29mm/px · 1 of 28 slices shown (5 of 5)]
[im 1/28]
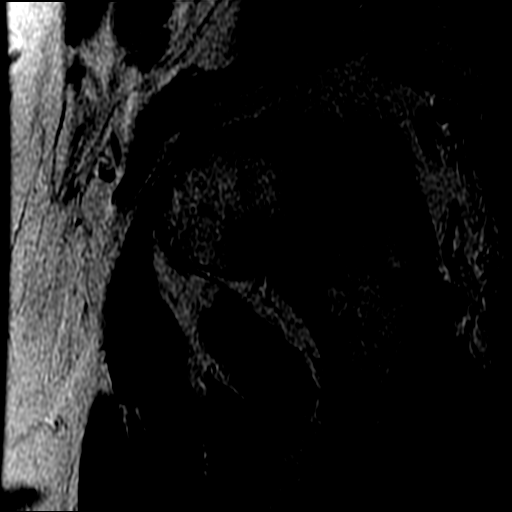

[Series 11: DWI · axial · 3.0mm · 0.59mm/px · 1 of 56 slices shown (1 of 6)]
[im 1/56]
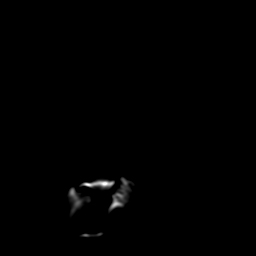

[Series 12: DWI · axial · 3.0mm · 0.59mm/px · 1 of 56 slices shown (2 of 6)]
[im 1/56]
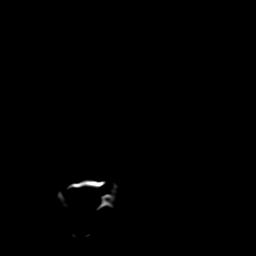

[Series 13: DWI · axial · 3.0mm · 0.59mm/px · 1 of 55 slices shown (3 of 6)]
[im 1/55]
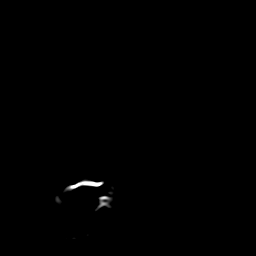

[Series 700: reformatted · axial · 1.8mm · 0.47mm/px · 1 of 138 slices shown (1 of 2)]
[im 1/138]
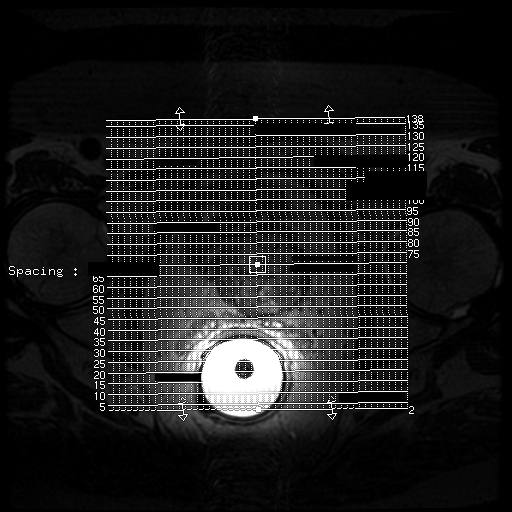

[Series 701: reformatted · axial · 1.8mm · 0.47mm/px · 1 of 126 slices shown (2 of 2)]
[im 1/126]
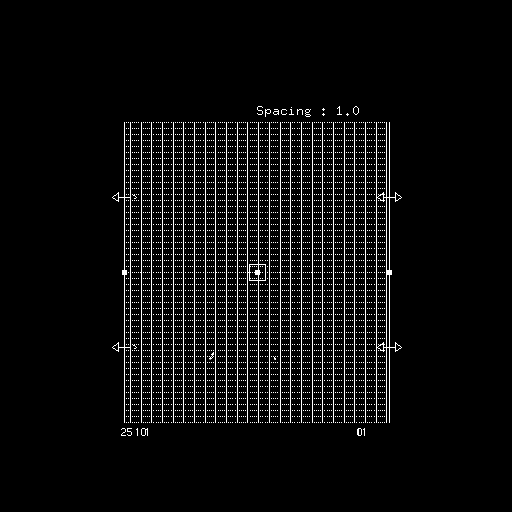

[Series 1100: DWI · axial · 3.0mm · 0.59mm/px · 1 of 28 slices shown (4 of 6)]
[im 1/28]
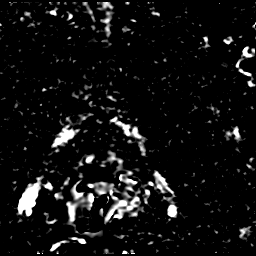

[Series 1200: DWI · axial · 3.0mm · 0.59mm/px · 1 of 28 slices shown (5 of 6)]
[im 1/28]
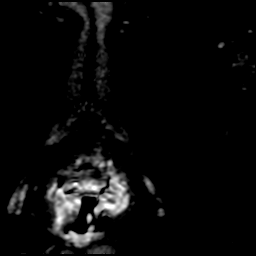

[Series 1300: DWI · axial · 3.0mm · 0.59mm/px · 1 of 28 slices shown (6 of 6)]
[im 1/28]
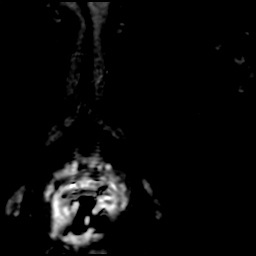

[((id)/(id)/1)-((id)/(id)/1) · axial · 3.0mm · 0.43mm/px · 1 of 70 slices shown (1 of 7)]
[im 1/70]
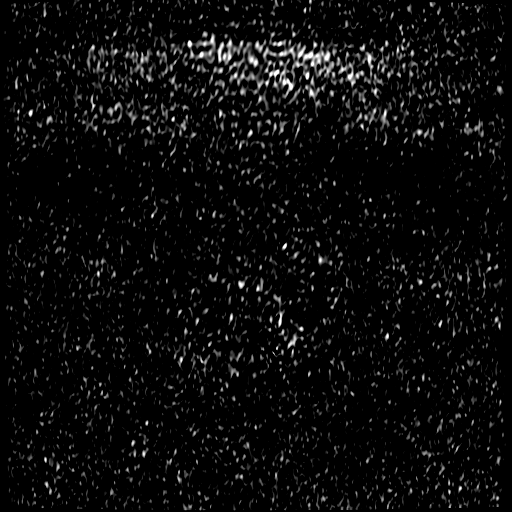

[((id)/(id)/1)-((id)/(id)/1) · axial · 3.0mm · 0.43mm/px · 1 of 72 slices shown (2 of 7)]
[im 1/72]
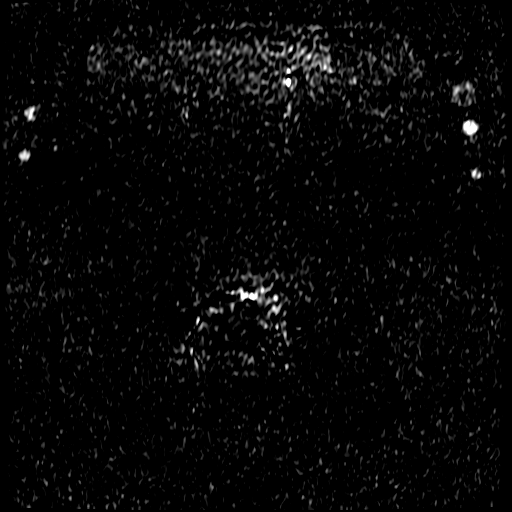

[((id)/(id)/1)-((id)/(id)/1) · axial · 3.0mm · 0.43mm/px · 1 of 72 slices shown (3 of 7)]
[im 1/72]
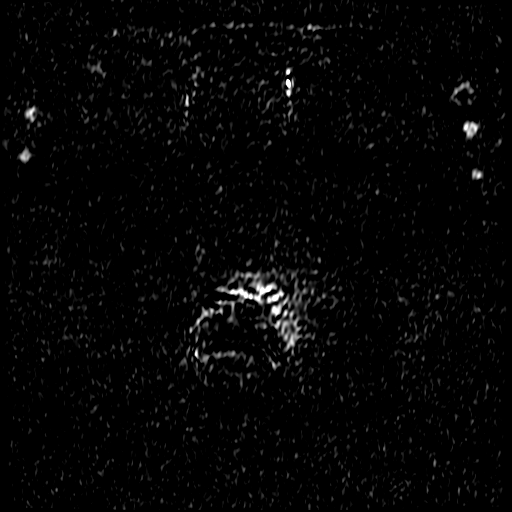

[((id)/(id)/1)-((id)/(id)/1) · axial · 3.0mm · 0.43mm/px · 1 of 72 slices shown (4 of 7)]
[im 1/72]
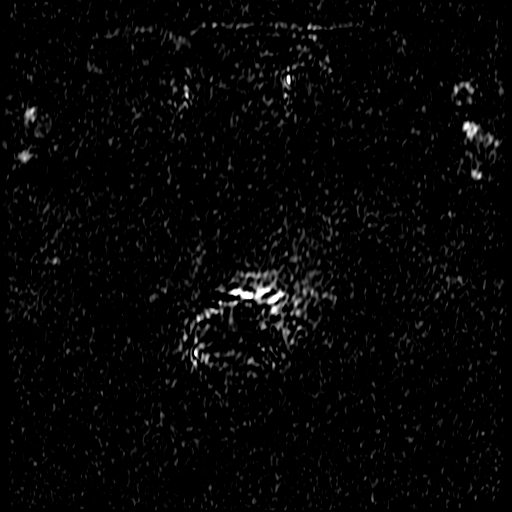

[((id)/(id)/1)-((id)/(id)/1) · axial · 3.0mm · 0.43mm/px · 1 of 72 slices shown (5 of 7)]
[im 1/72]
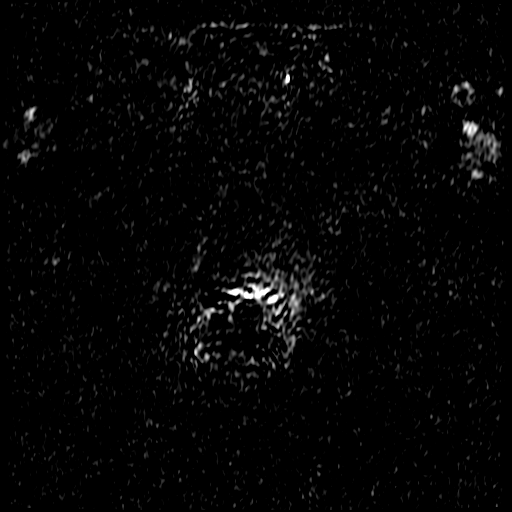

[((id)/(id)/1)-((id)/(id)/1) · axial · 3.0mm · 0.43mm/px · 1 of 72 slices shown (6 of 7)]
[im 1/72]
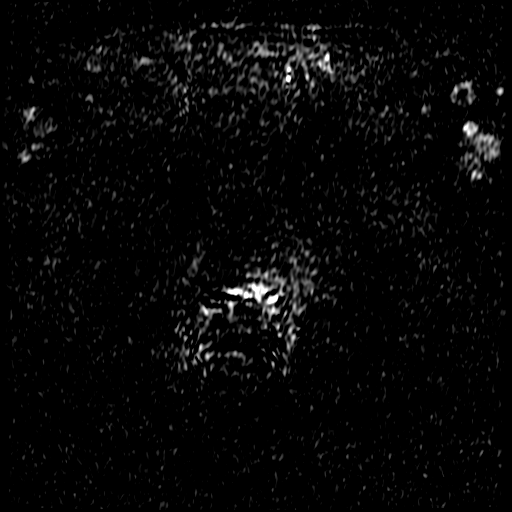

[((id)/(id)/1)-((id)/(id)/1) · axial · 3.0mm · 0.43mm/px · 1 of 72 slices shown (7 of 7)]
[im 1/72]
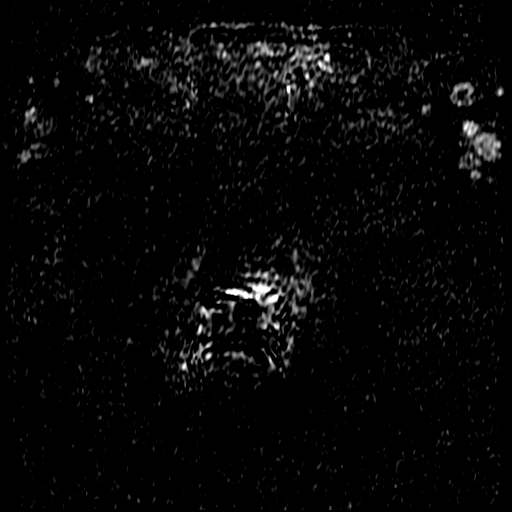

[23 of 56 positions shown; findings below may reference images not displayed]

FINDINGS: Prostate: In the left inferior apex, IA 1.7 by 1.4 cm lesion with
low T2 signal has associated reduced ADC activity on image 4/0199,
along with abnormal early enhancement and delayed enhancement (type
2 enhancement). I score this at PI-RADS IV, probably malignant.

More cephalad in the left gland, there is some lobulation of the
central zone along the peripheral zone with characteristic
appearance for benign prostatic hypertrophy.

Overall prostate size 6.4 by 4.7 by 5.6 cm (volume = 88 cm^3). The
median lobe indents the bladder base.

Transcapsular spread:  Absent

Seminal vesicle involvement: Absent

Neurovascular bundle involvement: Absent

Pelvic adenopathy: Absent

Bone metastasis: Absent

Other findings: Small bilateral scrotal hydroceles. Sigmoid colon
diverticulosis.
IMPRESSION: 1. PI-RADS category 4 lesion in the left apical peripheral zone,
probably malignant. I was not able to get this exam to show up in
the DynaCAD system for gland segmentation. I have IT assistant
personnel working to try to get the exam in DynaCAD and I plan to
perform post processing when the exam is available.
2. Benign prostatic hypertrophy, overall prostate size about 88 cc.
DynaCAD processing may refine the overall prostate volume.

## 2017-09-21 ENCOUNTER — Other Ambulatory Visit: Payer: Self-pay | Admitting: Family Medicine

## 2017-09-21 DIAGNOSIS — E118 Type 2 diabetes mellitus with unspecified complications: Secondary | ICD-10-CM

## 2017-09-23 DIAGNOSIS — Z961 Presence of intraocular lens: Secondary | ICD-10-CM | POA: Diagnosis not present

## 2017-09-23 DIAGNOSIS — E119 Type 2 diabetes mellitus without complications: Secondary | ICD-10-CM | POA: Diagnosis not present

## 2017-09-23 LAB — HM DIABETES EYE EXAM

## 2017-10-04 ENCOUNTER — Encounter: Payer: Self-pay | Admitting: Family Medicine

## 2017-10-10 ENCOUNTER — Other Ambulatory Visit: Payer: Self-pay | Admitting: Family Medicine

## 2017-10-10 DIAGNOSIS — E1159 Type 2 diabetes mellitus with other circulatory complications: Secondary | ICD-10-CM

## 2017-10-10 DIAGNOSIS — I152 Hypertension secondary to endocrine disorders: Secondary | ICD-10-CM

## 2017-10-10 DIAGNOSIS — I10 Essential (primary) hypertension: Principal | ICD-10-CM

## 2017-11-24 ENCOUNTER — Encounter: Payer: Self-pay | Admitting: Family Medicine

## 2017-11-24 ENCOUNTER — Ambulatory Visit (INDEPENDENT_AMBULATORY_CARE_PROVIDER_SITE_OTHER): Payer: Medicare HMO | Admitting: Family Medicine

## 2017-11-24 VITALS — BP 150/70 | HR 70 | Wt 182.8 lb

## 2017-11-24 DIAGNOSIS — E785 Hyperlipidemia, unspecified: Secondary | ICD-10-CM | POA: Diagnosis not present

## 2017-11-24 DIAGNOSIS — R972 Elevated prostate specific antigen [PSA]: Secondary | ICD-10-CM | POA: Diagnosis not present

## 2017-11-24 DIAGNOSIS — I10 Essential (primary) hypertension: Secondary | ICD-10-CM | POA: Diagnosis not present

## 2017-11-24 DIAGNOSIS — E1169 Type 2 diabetes mellitus with other specified complication: Secondary | ICD-10-CM

## 2017-11-24 DIAGNOSIS — E118 Type 2 diabetes mellitus with unspecified complications: Secondary | ICD-10-CM | POA: Diagnosis not present

## 2017-11-24 DIAGNOSIS — D692 Other nonthrombocytopenic purpura: Secondary | ICD-10-CM

## 2017-11-24 DIAGNOSIS — E1159 Type 2 diabetes mellitus with other circulatory complications: Secondary | ICD-10-CM

## 2017-11-24 DIAGNOSIS — I152 Hypertension secondary to endocrine disorders: Secondary | ICD-10-CM

## 2017-11-24 LAB — POCT GLYCOSYLATED HEMOGLOBIN (HGB A1C): Hemoglobin A1C: 7.3

## 2017-11-24 NOTE — Progress Notes (Signed)
  Subjective:    Patient ID: Maxwell Sanders, male    DOB: [birth year], 77 y.o.   MRN: [id number]  Maxwell Sanders is a 77 y.o. male who presents for follow-up of Type 2 diabetes mellitus.  Patient is checking home blood sugars.   Home blood sugar records: BGs range between 110 and 130 How often is blood sugars being checked: 1-2 weekly Current symptoms/problems include none and have been unchanged. Daily foot checks: yes   Any foot concerns: no Last eye exam: 09/2017 Exercise: The patient does not participate in regular exercise at present.  He does exercise at home as well as keeps himself very busy.  He has had recent urologic evaluation with a PSA which apparently was down slightly.  He has previous history of negative biopsies.  He continues on simvastatin for his lipids.  He is also taking metoprolol, lisinopril for his blood pressure and having no difficulty with that.  He takes metformin for the blood sugar.  The following portions of the patient's history were reviewed and updated as appropriate: allergies, current medications, past medical history, past social history and problem list.  ROS as in subjective above.     Objective:    Physical Exam Alert and in no distress .  Forearms do show purpuric lesions.   Lab Review Diabetic Labs Latest Ref Rng & Units 07/27/2017 03/10/2017 11/08/2016 08/26/2016 07/22/2016  HbA1c - 7.1 7.0 7.3 - 7.1  Microalbumin mg/L 7.5 - - - <5.0  Micro/Creat Ratio - 12.6 - - - <17.7  Chol <200 mg/dL 143 - - - 148  HDL >40 mg/dL 55 - - - 56  Calc LDL <130 mg/dL - - - - 77  Triglycerides <150 mg/dL 92 - - - 76  Creatinine 0.70 - 1.18 mg/dL 0.86 - - 0.70 0.76   BP/Weight 07/27/2017 03/10/2017 11/08/2016 04/08/6159 05/24/7105  Systolic BP 269 485 462 703 500  Diastolic BP 90 70 60 68 70  Wt. (Lbs) 175.2 182 184 182 184  BMI 26.64 27.67 27.98 27.67 27.98   Foot/eye exam completion dates Latest Ref Rng & Units 09/23/2017 07/27/2017  Eye Exam No Retinopathy No  Retinopathy -  Foot Form Completion - - Done  A1c is 7.3  [place]  reports that he has quit smoking. he has never used smokeless tobacco. He reports that he does not drink alcohol or use drugs.     Assessment & Plan:    Type 2 diabetes mellitus with complication, without long-term current use of insulin (HCC) - Plan: HgB A1c  Hyperlipidemia associated with type 2 diabetes mellitus ([place])  Hypertension associated with diabetes ([place])  Elevated PSA  Senile purpura ([place])   1. Rx changes: none 2. Education: Reviewed 'ABCs' of diabetes management (respective goals in parentheses):  A1C (<7), blood pressure (<130/80), and cholesterol (LDL <100). 3. Compliance at present is estimated to be fair. Efforts to improve compliance (if necessary) will be directed at Maintain present lifestyle. 4. Follow up: 4 months for his routine diabetes check however he is to return here in 1 week to check his blood pressure.  He does state that his blood pressure at home is much lower. I also encouraged him to get the Shingrix vaccine.

## 2017-11-29 ENCOUNTER — Other Ambulatory Visit: Payer: Medicare HMO

## 2017-12-09 ENCOUNTER — Other Ambulatory Visit: Payer: Medicare HMO

## 2017-12-20 ENCOUNTER — Other Ambulatory Visit: Payer: Self-pay | Admitting: Family Medicine

## 2017-12-20 DIAGNOSIS — E118 Type 2 diabetes mellitus with unspecified complications: Secondary | ICD-10-CM

## 2018-01-12 ENCOUNTER — Other Ambulatory Visit: Payer: Self-pay | Admitting: Family Medicine

## 2018-01-12 DIAGNOSIS — E1159 Type 2 diabetes mellitus with other circulatory complications: Secondary | ICD-10-CM

## 2018-01-12 DIAGNOSIS — I152 Hypertension secondary to endocrine disorders: Secondary | ICD-10-CM

## 2018-01-12 DIAGNOSIS — I10 Essential (primary) hypertension: Principal | ICD-10-CM

## 2018-02-09 DIAGNOSIS — L570 Actinic keratosis: Secondary | ICD-10-CM | POA: Diagnosis not present

## 2018-02-09 DIAGNOSIS — L821 Other seborrheic keratosis: Secondary | ICD-10-CM | POA: Diagnosis not present

## 2018-02-09 DIAGNOSIS — Z85828 Personal history of other malignant neoplasm of skin: Secondary | ICD-10-CM | POA: Diagnosis not present

## 2018-02-21 DIAGNOSIS — R69 Illness, unspecified: Secondary | ICD-10-CM | POA: Diagnosis not present

## 2018-02-27 DIAGNOSIS — R69 Illness, unspecified: Secondary | ICD-10-CM | POA: Diagnosis not present

## 2018-03-08 DIAGNOSIS — R69 Illness, unspecified: Secondary | ICD-10-CM | POA: Diagnosis not present

## 2018-03-15 ENCOUNTER — Other Ambulatory Visit: Payer: Self-pay | Admitting: Family Medicine

## 2018-03-15 DIAGNOSIS — E118 Type 2 diabetes mellitus with unspecified complications: Secondary | ICD-10-CM

## 2018-03-20 DIAGNOSIS — R69 Illness, unspecified: Secondary | ICD-10-CM | POA: Diagnosis not present

## 2018-04-12 ENCOUNTER — Other Ambulatory Visit: Payer: Self-pay | Admitting: Family Medicine

## 2018-04-12 ENCOUNTER — Encounter: Payer: Self-pay | Admitting: Family Medicine

## 2018-04-12 ENCOUNTER — Ambulatory Visit (INDEPENDENT_AMBULATORY_CARE_PROVIDER_SITE_OTHER): Payer: Medicare HMO | Admitting: Family Medicine

## 2018-04-12 VITALS — BP 142/78 | HR 60 | Temp 97.7°F | Ht 67.75 in | Wt 175.6 lb

## 2018-04-12 DIAGNOSIS — I10 Essential (primary) hypertension: Secondary | ICD-10-CM | POA: Diagnosis not present

## 2018-04-12 DIAGNOSIS — E785 Hyperlipidemia, unspecified: Secondary | ICD-10-CM

## 2018-04-12 DIAGNOSIS — E1169 Type 2 diabetes mellitus with other specified complication: Secondary | ICD-10-CM

## 2018-04-12 DIAGNOSIS — E118 Type 2 diabetes mellitus with unspecified complications: Secondary | ICD-10-CM

## 2018-04-12 DIAGNOSIS — E1159 Type 2 diabetes mellitus with other circulatory complications: Secondary | ICD-10-CM | POA: Diagnosis not present

## 2018-04-12 DIAGNOSIS — I152 Hypertension secondary to endocrine disorders: Secondary | ICD-10-CM

## 2018-04-12 DIAGNOSIS — E119 Type 2 diabetes mellitus without complications: Secondary | ICD-10-CM

## 2018-04-12 DIAGNOSIS — R69 Illness, unspecified: Secondary | ICD-10-CM | POA: Diagnosis not present

## 2018-04-12 LAB — POCT GLYCOSYLATED HEMOGLOBIN (HGB A1C): HEMOGLOBIN A1C: 6.7 % — AB (ref 4.0–5.6)

## 2018-04-12 MED ORDER — METOPROLOL TARTRATE 100 MG PO TABS: 100.0000 mg | ORAL_TABLET | Freq: Two times a day (BID) | ORAL | 3 refills | Status: DC

## 2018-04-12 MED ORDER — GLUCOSE BLOOD VI STRP: 1.0000 | ORAL_STRIP | Freq: Every day | 99 refills | Status: DC

## 2018-04-12 MED ORDER — MICROLET LANCETS MISC: 99 refills | Status: DC

## 2018-04-12 NOTE — Progress Notes (Signed)
  Subjective:    Patient ID: Maxwell Sanders, male    DOB: [birth year], 77 y.o.   MRN: [id number]  Maxwell Sanders is a 77 y.o. male who presents for follow-up of Type 2 diabetes mellitus.  Patient {is checking home blood sugars.   Home blood sugar records:meter records and write them down How often is blood sugars being checked: 2-3 times a week Current symptoms/problems include none and have been unchanged. Daily foot checks: yes   Any foot concerns: no Last eye exam: nov 2018 Exercise:walking and working He is taking metoprolol as well as lisinopril/HCTZ for his blood pressure.  Continues on simvastatin without problem.  He is also using Metformin and has no GI complaints.  Overall he seems to be doing much better.  The following portions of the patient's history were reviewed and updated as appropriate: allergies, current medications, past medical history, past social history and problem list.  ROS as in subjective above.     Objective:    Physical Exam Alert and in no distress otherwise not examined.    Lab Review Diabetic Labs Latest Ref Rng & Units 11/24/2017 07/27/2017 03/10/2017 11/08/2016 08/26/2016  HbA1c - 7.3 7.1 7.0 7.3 -  Microalbumin mg/L - 7.5 - - -  Micro/Creat Ratio - - 12.6 - - -  Chol <200 mg/dL - 143 - - -  HDL >40 mg/dL - 55 - - -  Calc LDL mg/dL (calc) - 70 - - -  Triglycerides <150 mg/dL - 92 - - -  Creatinine 0.70 - 1.18 mg/dL - 0.86 - - 0.70   BP/Weight 11/24/2017 07/27/2017 03/10/2017 11/08/2016 4/81/8563  Systolic BP 149 702 637 858 850  Diastolic BP 70 90 70 60 68  Wt. (Lbs) 182.8 175.2 182 184 182  BMI 27.79 26.64 27.67 27.98 27.67   Foot/eye exam completion dates Latest Ref Rng & Units 09/23/2017 07/27/2017  Eye Exam No Retinopathy No Retinopathy -  Foot Form Completion - - Done   A1c is 6.7 [name]  reports that he has quit smoking. He has never used smokeless tobacco. He reports that he does not drink alcohol or use drugs.     Assessment & Plan:   Hyperlipidemia associated with type 2 diabetes mellitus ([place])  Diabetes mellitus type 2, controlled ([place]) - Plan: MICROLET LANCETS MISC, POCT glycosylated hemoglobin (Hb A1C)  Hypertension associated with diabetes ([place])  Type 2 diabetes mellitus with complication, without long-term current use of insulin ([place])  Overall he seems to be doing much better although when to get his blood pressure under better control and I therefore increased his metoprolol.  1. Rx changes: none 2. Education: Reviewed 'ABCs' of diabetes management (respective goals in parentheses):  A1C (<7), blood pressure (<130/80), and cholesterol (LDL <100). 3. Compliance at present is estimated to be excellent. Efforts to improve compliance (if necessary) will be directed at increased exercise. 4. Follow up: 4 months

## 2018-04-14 ENCOUNTER — Other Ambulatory Visit: Payer: Self-pay | Admitting: Family Medicine

## 2018-04-14 ENCOUNTER — Telehealth: Payer: Self-pay | Admitting: Family Medicine

## 2018-04-14 DIAGNOSIS — I152 Hypertension secondary to endocrine disorders: Secondary | ICD-10-CM

## 2018-04-14 DIAGNOSIS — I10 Essential (primary) hypertension: Principal | ICD-10-CM

## 2018-04-14 DIAGNOSIS — E1159 Type 2 diabetes mellitus with other circulatory complications: Secondary | ICD-10-CM

## 2018-04-14 NOTE — Telephone Encounter (Signed)
Pt called. PT DOES NOT USE [facility]. I removed that pharmacy when pt was in but medication was sent there and reinstated. Please send all future refills to [facility].   Please call pt to verify that new meter was sent to correct pharmacy.

## 2018-04-14 NOTE — Telephone Encounter (Signed)
Left message for pt to call back as med [facility] [address]. Trying to find out where it needs to be sent to

## 2018-04-18 NOTE — Telephone Encounter (Signed)
Switched to [facility]

## 2018-06-13 ENCOUNTER — Other Ambulatory Visit: Payer: Self-pay | Admitting: Family Medicine

## 2018-06-13 DIAGNOSIS — E118 Type 2 diabetes mellitus with unspecified complications: Secondary | ICD-10-CM

## 2018-07-03 ENCOUNTER — Other Ambulatory Visit: Payer: Self-pay

## 2018-07-03 DIAGNOSIS — I10 Essential (primary) hypertension: Principal | ICD-10-CM

## 2018-07-03 DIAGNOSIS — I152 Hypertension secondary to endocrine disorders: Secondary | ICD-10-CM

## 2018-07-03 DIAGNOSIS — E1159 Type 2 diabetes mellitus with other circulatory complications: Secondary | ICD-10-CM

## 2018-07-03 MED ORDER — METOPROLOL TARTRATE 100 MG PO TABS: 100.0000 mg | ORAL_TABLET | Freq: Two times a day (BID) | ORAL | 3 refills | Status: DC

## 2018-08-02 ENCOUNTER — Ambulatory Visit (INDEPENDENT_AMBULATORY_CARE_PROVIDER_SITE_OTHER): Payer: Medicare HMO | Admitting: Family Medicine

## 2018-08-02 ENCOUNTER — Encounter: Payer: Self-pay | Admitting: Family Medicine

## 2018-08-02 VITALS — BP 138/72 | HR 61 | Temp 98.0°F | Ht 68.0 in | Wt 172.2 lb

## 2018-08-02 DIAGNOSIS — D692 Other nonthrombocytopenic purpura: Secondary | ICD-10-CM

## 2018-08-02 DIAGNOSIS — Z1211 Encounter for screening for malignant neoplasm of colon: Secondary | ICD-10-CM

## 2018-08-02 DIAGNOSIS — N528 Other male erectile dysfunction: Secondary | ICD-10-CM | POA: Diagnosis not present

## 2018-08-02 DIAGNOSIS — R972 Elevated prostate specific antigen [PSA]: Secondary | ICD-10-CM

## 2018-08-02 DIAGNOSIS — Z0000 Encounter for general adult medical examination without abnormal findings: Secondary | ICD-10-CM

## 2018-08-02 DIAGNOSIS — I4510 Unspecified right bundle-branch block: Secondary | ICD-10-CM

## 2018-08-02 DIAGNOSIS — E785 Hyperlipidemia, unspecified: Secondary | ICD-10-CM | POA: Diagnosis not present

## 2018-08-02 DIAGNOSIS — E1169 Type 2 diabetes mellitus with other specified complication: Secondary | ICD-10-CM | POA: Diagnosis not present

## 2018-08-02 DIAGNOSIS — I10 Essential (primary) hypertension: Secondary | ICD-10-CM

## 2018-08-02 DIAGNOSIS — E118 Type 2 diabetes mellitus with unspecified complications: Secondary | ICD-10-CM | POA: Diagnosis not present

## 2018-08-02 DIAGNOSIS — J302 Other seasonal allergic rhinitis: Secondary | ICD-10-CM

## 2018-08-02 DIAGNOSIS — N401 Enlarged prostate with lower urinary tract symptoms: Secondary | ICD-10-CM

## 2018-08-02 DIAGNOSIS — E1159 Type 2 diabetes mellitus with other circulatory complications: Secondary | ICD-10-CM

## 2018-08-02 DIAGNOSIS — I152 Hypertension secondary to endocrine disorders: Secondary | ICD-10-CM

## 2018-08-02 DIAGNOSIS — C4440 Unspecified malignant neoplasm of skin of scalp and neck: Secondary | ICD-10-CM

## 2018-08-02 LAB — POCT GLYCOSYLATED HEMOGLOBIN (HGB A1C): HEMOGLOBIN A1C: 6.9 % — AB (ref 4.0–5.6)

## 2018-08-02 MED ORDER — SIMVASTATIN 20 MG PO TABS: 20.0000 mg | ORAL_TABLET | Freq: Every day | ORAL | 3 refills | Status: DC

## 2018-08-02 MED ORDER — TADALAFIL 20 MG PO TABS: 10.0000 mg | ORAL_TABLET | ORAL | 0 refills | Status: DC | PRN

## 2018-08-02 MED ORDER — LISINOPRIL-HYDROCHLOROTHIAZIDE 20-12.5 MG PO TABS: 1.0000 | ORAL_TABLET | Freq: Every day | ORAL | 3 refills | Status: DC

## 2018-08-02 NOTE — Progress Notes (Signed)
Maxwell Sanders is a 77 y.o. male who presents for annual wellness visit,CPE and follow-up on chronic medical conditions.  He does have underlying diabetes.  He continues on metformin and having no difficulty with that.  Previous hemoglobin A1c readings were all quite good.  He continues on simvastatin for his cholesterol.  He is using Cialis and getting it out of candidate for his BPH symptoms.  He states that Cialis is not as effective for his ED.  He also has an elevated PSA.  His allergies are under good control.  He sees his dermatologist regularly for evaluation and treatment of skin cancer.  Review of the record indicates a previous history of RBBB.  He is concerned about colon cancer.  He does keep himself quite busy physically and does get a lot of bruising on his arms due to that.  Immunizations and Health Maintenance Immunization History  Administered Date(s) Administered  . DT 11/07/2002  . Influenza, High Dose Seasonal PF 12/02/2015, 07/22/2016, 07/27/2017  . Influenza, Seasonal, Injecte, Preservative Fre 12/07/2012  . Pneumococcal Conjugate-13 06/21/2013  . Pneumococcal Polysaccharide-23 05/11/2006  . Tdap 04/21/2012  . Zoster 03/14/2008  . Zoster Recombinat (Shingrix) 04/10/2018, 06/17/2018   Health Maintenance Due  Topic Date Due  . INFLUENZA VACCINE  06/22/2018  . FOOT EXAM  07/27/2018    Last colonoscopy: 8 years ago and had cologuard Last PSA: last year  Dentist: 6 months ago Ophtho:last year due again in November Exercise: yard work  Other doctors caring for patient include: [name]  Advanced Directives: Yes.  Copy asked for.    Depression screen:  See questionnaire below.     Depression screen [facility] 2/9 08/02/2018 07/27/2017 07/22/2016 07/11/2015 07/08/2014  Decreased Interest 0 0 0 0 0  Down, Depressed, Hopeless 0 0 0 0 0  PHQ - 2 Score 0 0 0 0 0    Fall Screen: See Questionaire below.   Fall Risk  08/02/2018 07/27/2017 07/22/2016 07/11/2015 07/08/2014  Falls in  the past year? No No No No No    ADL screen:  See questionnaire below.  Functional Status Survey: Is the patient deaf or have difficulty hearing?: No Does the patient have difficulty seeing, even when wearing glasses/contacts?: No Does the patient have difficulty concentrating, remembering, or making decisions?: No Does the patient have difficulty walking or climbing stairs?: No Does the patient have difficulty dressing or bathing?: No Does the patient have difficulty doing errands alone such as visiting a doctor's office or shopping?: No   Review of Systems  Constitutional: -, -unexpected weight change, -anorexia, -fatigue Allergy: -sneezing, -itching, -congestion Dermatology: denies changing moles, rash, lumps ENT: -runny nose, -ear pain, -sore throat,  Cardiology:  -chest pain, -palpitations, -orthopnea, Respiratory: -cough, -shortness of breath, -dyspnea on exertion, -wheezing,  Gastroenterology: -abdominal pain, -nausea, -vomiting, -diarrhea, -constipation, -dysphagia Hematology: -bleeding or bruising problems Musculoskeletal: -arthralgias, -myalgias, -joint swelling, -back pain, - Ophthalmology: -vision changes,  Urology: -dysuria, -difficulty urinating,  -urinary frequency, -urgency, incontinence Neurology: -, -numbness, , -memory loss, -falls, -dizziness    PHYSICAL EXAM:   General Appearance: Alert, cooperative, no distress, appears stated age Head: Normocephalic, without obvious abnormality, atraumatic Eyes: PERRL, conjunctiva/corneas clear, EOM's intact, fundi benign Ears: Normal TM's and external ear canals Nose: Nares normal, mucosa normal, no drainage or sinus   tenderness Throat: Lips, mucosa, and tongue normal; teeth and gums normal Neck: Supple, no lymphadenopathy, thyroid:no enlargement/tenderness/nodules; no carotid bruit or JVD Lungs: Clear to auscultation bilaterally without wheezes, rales or ronchi;  respirations unlabored Heart: Regular rate and rhythm,  S1 and S2 normal, no murmur, rub or gallop Abdomen: Soft, non-tender, nondistended, normoactive bowel sounds, no masses, no hepatosplenomegaly Extremities: No clubbing, cyanosis or edema.  Purpuric lesions noted on both forearms. Pulses: 2+ and symmetric all extremities Skin: Skin color, texture, turgor normal, no rashes or lesions Lymph nodes: Cervical, supraclavicular, and axillary nodes normal Neurologic: CNII-XII intact, normal strength, sensation and gait; reflexes 2+ and symmetric throughout   Psych: Normal mood, affect, hygiene and grooming Foot exam is negative PSA is 6.9  ASSESSMENT/PLAN: Routine general medical examination at a health care facility - Plan: CBC with Differential/Platelet, Comprehensive metabolic panel, Lipid panel  Seasonal allergic rhinitis, unspecified trigger  Benign prostatic hyperplasia with lower urinary tract symptoms, symptom details unspecified - Plan: tadalafil (ADCIRCA/CIALIS) 20 MG tablet  Elevated PSA - Plan: PSA  Hyperlipidemia associated with type 2 diabetes mellitus ([place]) - Plan: Lipid panel, simvastatin (ZOCOR) 20 MG tablet  Hypertension associated with diabetes ([place]) - Plan: CBC with Differential/Platelet, Comprehensive metabolic panel, lisinopril-hydrochlorothiazide (PRINZIDE,ZESTORETIC) 20-12.5 MG tablet  RBBB - Plan: CBC with Differential/Platelet, Comprehensive metabolic panel, Lipid panel  Senile purpura (HCC)  Skin cancer of scalp  Type 2 diabetes mellitus with complication, without long-term current use of insulin (HCC) - Plan: CBC with Differential/Platelet, Comprehensive metabolic panel, Lipid panel, POCT UA - Microalbumin  Other male erectile dysfunction - Plan: tadalafil (ADCIRCA/CIALIS) 20 MG tablet  Screening for colon cancer - Plan: Cologuard He will continue on his present medication regimen.  Medications up for renewal were renewed.  He will send off to [place] for his Cialis.  He does have a follow-up appointment with  ophthalmology.  I encouraged him to continue with his very active lifestyle. Medicare Attestation I have personally reviewed: The patient's medical and social history Their use of alcohol, tobacco or illicit drugs Their current medications and supplements The patient's functional ability including ADLs,fall risks, home safety risks, cognitive, and hearing and visual impairment Diet and physical activities Evidence for depression or mood disorders  The patient's weight, height, and BMI have been recorded in the chart.  I have made referrals, counseling, and provided education to the patient based on review of the above and I have provided the patient with a written personalized care plan for preventive services.     Jill Alexanders, MD   08/02/2018

## 2018-08-03 LAB — CBC WITH DIFFERENTIAL/PLATELET
BASOS ABS: 0.1 10*3/uL (ref 0.0–0.2)
Basos: 1 %
EOS (ABSOLUTE): 0.2 10*3/uL (ref 0.0–0.4)
EOS: 2 %
HEMATOCRIT: 41.8 % (ref 37.5–51.0)
Hemoglobin: 14.4 g/dL (ref 13.0–17.7)
IMMATURE GRANULOCYTES: 0 %
Immature Grans (Abs): 0 10*3/uL (ref 0.0–0.1)
Lymphocytes Absolute: 2.1 10*3/uL (ref 0.7–3.1)
Lymphs: 23 %
MCH: 31.7 pg (ref 26.6–33.0)
MCHC: 34.4 g/dL (ref 31.5–35.7)
MCV: 92 fL (ref 79–97)
MONOS ABS: 1 10*3/uL — AB (ref 0.1–0.9)
Monocytes: 11 %
NEUTROS PCT: 63 %
Neutrophils Absolute: 5.6 10*3/uL (ref 1.4–7.0)
Platelets: 254 10*3/uL (ref 150–450)
RBC: 4.54 x10E6/uL (ref 4.14–5.80)
RDW: 12.2 % — AB (ref 12.3–15.4)
WBC: 8.9 10*3/uL (ref 3.4–10.8)

## 2018-08-03 LAB — COMPREHENSIVE METABOLIC PANEL
ALBUMIN: 4.7 g/dL (ref 3.5–4.8)
ALT: 18 IU/L (ref 0–44)
AST: 25 IU/L (ref 0–40)
Albumin/Globulin Ratio: 1.9 (ref 1.2–2.2)
Alkaline Phosphatase: 72 IU/L (ref 39–117)
BUN / CREAT RATIO: 21 (ref 10–24)
BUN: 16 mg/dL (ref 8–27)
Bilirubin Total: 0.9 mg/dL (ref 0.0–1.2)
CO2: 23 mmol/L (ref 20–29)
CREATININE: 0.75 mg/dL — AB (ref 0.76–1.27)
Calcium: 9.9 mg/dL (ref 8.6–10.2)
Chloride: 99 mmol/L (ref 96–106)
GFR calc non Af Amer: 88 mL/min/{1.73_m2} (ref >59)
GFR, EST AFRICAN AMERICAN: 102 mL/min/{1.73_m2} (ref >59)
Globulin, Total: 2.5 g/dL (ref 1.5–4.5)
Glucose: 134 mg/dL — ABNORMAL HIGH (ref 65–99)
Potassium: 4.6 mmol/L (ref 3.5–5.2)
Sodium: 140 mmol/L (ref 134–144)
TOTAL PROTEIN: 7.2 g/dL (ref 6.0–8.5)

## 2018-08-03 LAB — LIPID PANEL
CHOLESTEROL TOTAL: 148 mg/dL (ref 100–199)
Chol/HDL Ratio: 2.7 ratio (ref 0.0–5.0)
HDL: 55 mg/dL (ref >39)
LDL CALC: 77 mg/dL (ref 0–99)
Triglycerides: 78 mg/dL (ref 0–149)
VLDL Cholesterol Cal: 16 mg/dL (ref 5–40)

## 2018-08-03 LAB — PSA: PROSTATE SPECIFIC AG, SERUM: 6.1 ng/mL — AB (ref 0.0–4.0)

## 2018-08-08 ENCOUNTER — Encounter: Payer: Self-pay | Admitting: Family Medicine

## 2018-08-08 DIAGNOSIS — L570 Actinic keratosis: Secondary | ICD-10-CM | POA: Diagnosis not present

## 2018-08-08 DIAGNOSIS — D1801 Hemangioma of skin and subcutaneous tissue: Secondary | ICD-10-CM | POA: Diagnosis not present

## 2018-08-08 DIAGNOSIS — Z85828 Personal history of other malignant neoplasm of skin: Secondary | ICD-10-CM | POA: Diagnosis not present

## 2018-08-08 DIAGNOSIS — D692 Other nonthrombocytopenic purpura: Secondary | ICD-10-CM | POA: Diagnosis not present

## 2018-08-08 DIAGNOSIS — L821 Other seborrheic keratosis: Secondary | ICD-10-CM | POA: Diagnosis not present

## 2018-08-09 DIAGNOSIS — Z1211 Encounter for screening for malignant neoplasm of colon: Secondary | ICD-10-CM | POA: Diagnosis not present

## 2018-08-10 LAB — COLOGUARD: COLOGUARD: NEGATIVE

## 2018-08-15 ENCOUNTER — Telehealth: Payer: Self-pay

## 2018-08-15 ENCOUNTER — Ambulatory Visit: Payer: Medicare HMO | Admitting: Family Medicine

## 2018-08-15 NOTE — Progress Notes (Signed)
Other

## 2018-08-15 NOTE — Telephone Encounter (Signed)
Called pt to advise that cologuard was negative. LVM for pt to call office back. [place]

## 2018-08-15 NOTE — Telephone Encounter (Signed)
Pt was advise of cologuard being negative . [place]

## 2018-09-05 DIAGNOSIS — R69 Illness, unspecified: Secondary | ICD-10-CM | POA: Diagnosis not present

## 2018-09-14 DIAGNOSIS — R69 Illness, unspecified: Secondary | ICD-10-CM | POA: Diagnosis not present

## 2018-09-26 ENCOUNTER — Other Ambulatory Visit: Payer: Self-pay | Admitting: Family Medicine

## 2018-09-26 DIAGNOSIS — E118 Type 2 diabetes mellitus with unspecified complications: Secondary | ICD-10-CM

## 2018-09-28 DIAGNOSIS — H04123 Dry eye syndrome of bilateral lacrimal glands: Secondary | ICD-10-CM | POA: Diagnosis not present

## 2018-09-28 DIAGNOSIS — Z961 Presence of intraocular lens: Secondary | ICD-10-CM | POA: Diagnosis not present

## 2018-09-28 DIAGNOSIS — E119 Type 2 diabetes mellitus without complications: Secondary | ICD-10-CM | POA: Diagnosis not present

## 2018-09-28 LAB — HM DIABETES EYE EXAM

## 2018-10-02 DIAGNOSIS — R69 Illness, unspecified: Secondary | ICD-10-CM | POA: Diagnosis not present

## 2018-12-06 DIAGNOSIS — R69 Illness, unspecified: Secondary | ICD-10-CM | POA: Diagnosis not present

## 2018-12-12 ENCOUNTER — Ambulatory Visit (INDEPENDENT_AMBULATORY_CARE_PROVIDER_SITE_OTHER): Payer: Medicare HMO | Admitting: Family Medicine

## 2018-12-12 ENCOUNTER — Encounter: Payer: Self-pay | Admitting: Family Medicine

## 2018-12-12 VITALS — BP 130/80 | HR 64 | Temp 97.9°F | Wt 176.6 lb

## 2018-12-12 DIAGNOSIS — E785 Hyperlipidemia, unspecified: Secondary | ICD-10-CM | POA: Diagnosis not present

## 2018-12-12 DIAGNOSIS — R972 Elevated prostate specific antigen [PSA]: Secondary | ICD-10-CM

## 2018-12-12 DIAGNOSIS — Z23 Encounter for immunization: Secondary | ICD-10-CM | POA: Diagnosis not present

## 2018-12-12 DIAGNOSIS — E1169 Type 2 diabetes mellitus with other specified complication: Secondary | ICD-10-CM | POA: Diagnosis not present

## 2018-12-12 DIAGNOSIS — N401 Enlarged prostate with lower urinary tract symptoms: Secondary | ICD-10-CM | POA: Diagnosis not present

## 2018-12-12 DIAGNOSIS — I10 Essential (primary) hypertension: Secondary | ICD-10-CM | POA: Diagnosis not present

## 2018-12-12 DIAGNOSIS — E118 Type 2 diabetes mellitus with unspecified complications: Secondary | ICD-10-CM

## 2018-12-12 DIAGNOSIS — I152 Hypertension secondary to endocrine disorders: Secondary | ICD-10-CM

## 2018-12-12 DIAGNOSIS — E1159 Type 2 diabetes mellitus with other circulatory complications: Secondary | ICD-10-CM

## 2018-12-12 LAB — POCT GLYCOSYLATED HEMOGLOBIN (HGB A1C): HEMOGLOBIN A1C: 7.2 % — AB (ref 4.0–5.6)

## 2018-12-12 NOTE — Progress Notes (Signed)
  Subjective:    Patient ID: Maxwell Sanders, male    DOB: 24-Aug-1941, 78 y.o.   MRN: [id number]  Maxwell Sanders is a 78 y.o. male who presents for follow-up of Type 2 diabetes mellitus.  Patient is checking home blood sugars.   Home blood sugar records: BGs consistently in an acceptable range How often is blood sugars being checked: weekly Current symptoms/problems include none and have been unchanged. Daily foot checks: yes   Any foot concerns: none Last eye exam: 11/19 Exercise: The patient does not participate in regular exercise at present. He continues on metformin.  He is also taking lisinopril/HCTZ as well as metoprolol.  Also continues on simvastatin.  Having no difficulty with any of these medications.  Does have an elevated PSA but is comfortable in the watchful waiting mode.  He does have BPH but is having only intermittent symptoms with this. The following portions of the patient's history were reviewed and updated as appropriate: allergies, current medications, past medical history, past social history and problem list.  ROS as in subjective above.     Objective:    Physical Exam Alert and in no distress otherwise not examined.   Lab Review Diabetic Labs Latest Ref Rng & Units 08/02/2018 04/12/2018 11/24/2017 07/27/2017 03/10/2017  HbA1c 4.0 - 5.6 % 6.9(A) 6.7(A) 7.3 7.1 7.0  Microalbumin mg/L - - - 7.5 -  Micro/Creat Ratio - - - - 12.6 -  Chol 100 - 199 mg/dL 148 - - 143 -  HDL >39 mg/dL 55 - - 55 -  Calc LDL 0 - 99 mg/dL 77 - - 70 -  Triglycerides 0 - 149 mg/dL 78 - - 92 -  Creatinine 0.76 - 1.27 mg/dL 0.75(L) - - 0.86 -   BP/Weight 08/02/2018 04/12/2018 11/24/2017 07/27/2017 6/83/4196  Systolic BP 222 979 892 119 417  Diastolic BP 72 78 70 90 70  Wt. (Lbs) 172.2 175.6 182.8 175.2 182  BMI 26.18 26.9 27.79 26.64 27.67   Foot/eye exam completion dates Latest Ref Rng & Units 09/28/2018 08/02/2018  Eye Exam No Retinopathy No Retinopathy -  Foot Form Completion - - Done   A1C  7.2 Maxwell Sanders  reports that he has quit smoking. He has never used smokeless tobacco. He reports that he does not drink alcohol or use drugs.     Assessment & Plan:    Hyperlipidemia associated with type 2 diabetes mellitus ([place])  Hypertension associated with diabetes ([place])  Type 2 diabetes with complication (HCC)  Elevated PSA  Benign prostatic hyperplasia with lower urinary tract symptoms, symptom details unspecified  Need for influenza vaccination    1. Rx changes: none 2. Education: Reviewed 'ABCs' of diabetes management (respective goals in parentheses):  A1C (<7), blood pressure (<130/80), and cholesterol (LDL <100). 3. Compliance at present is estimated to be good. Efforts to improve compliance (if necessary) will be directed at Continue with present diet and exercise. 4. Follow up: 4 months

## 2018-12-21 ENCOUNTER — Other Ambulatory Visit: Payer: Self-pay | Admitting: Family Medicine

## 2018-12-21 DIAGNOSIS — E118 Type 2 diabetes mellitus with unspecified complications: Secondary | ICD-10-CM

## 2019-02-06 DIAGNOSIS — L821 Other seborrheic keratosis: Secondary | ICD-10-CM | POA: Diagnosis not present

## 2019-02-06 DIAGNOSIS — D692 Other nonthrombocytopenic purpura: Secondary | ICD-10-CM | POA: Diagnosis not present

## 2019-02-06 DIAGNOSIS — L570 Actinic keratosis: Secondary | ICD-10-CM | POA: Diagnosis not present

## 2019-02-06 DIAGNOSIS — D1801 Hemangioma of skin and subcutaneous tissue: Secondary | ICD-10-CM | POA: Diagnosis not present

## 2019-02-06 DIAGNOSIS — Z85828 Personal history of other malignant neoplasm of skin: Secondary | ICD-10-CM | POA: Diagnosis not present

## 2019-02-06 DIAGNOSIS — L820 Inflamed seborrheic keratosis: Secondary | ICD-10-CM | POA: Diagnosis not present

## 2019-02-06 DIAGNOSIS — L812 Freckles: Secondary | ICD-10-CM | POA: Diagnosis not present

## 2019-03-21 ENCOUNTER — Other Ambulatory Visit: Payer: Self-pay | Admitting: Family Medicine

## 2019-03-21 DIAGNOSIS — E118 Type 2 diabetes mellitus with unspecified complications: Secondary | ICD-10-CM

## 2019-04-17 ENCOUNTER — Ambulatory Visit (INDEPENDENT_AMBULATORY_CARE_PROVIDER_SITE_OTHER): Payer: Medicare HMO | Admitting: Family Medicine

## 2019-04-17 ENCOUNTER — Encounter: Payer: Self-pay | Admitting: Family Medicine

## 2019-04-17 ENCOUNTER — Other Ambulatory Visit: Payer: Self-pay

## 2019-04-17 VITALS — BP 140/84 | HR 78 | Temp 99.5°F | Wt 175.0 lb

## 2019-04-17 DIAGNOSIS — C4440 Unspecified malignant neoplasm of skin of scalp and neck: Secondary | ICD-10-CM

## 2019-04-17 DIAGNOSIS — E1169 Type 2 diabetes mellitus with other specified complication: Secondary | ICD-10-CM | POA: Diagnosis not present

## 2019-04-17 DIAGNOSIS — E785 Hyperlipidemia, unspecified: Secondary | ICD-10-CM

## 2019-04-17 DIAGNOSIS — I10 Essential (primary) hypertension: Secondary | ICD-10-CM | POA: Diagnosis not present

## 2019-04-17 DIAGNOSIS — I152 Hypertension secondary to endocrine disorders: Secondary | ICD-10-CM

## 2019-04-17 DIAGNOSIS — E1159 Type 2 diabetes mellitus with other circulatory complications: Secondary | ICD-10-CM

## 2019-04-17 DIAGNOSIS — E118 Type 2 diabetes mellitus with unspecified complications: Secondary | ICD-10-CM | POA: Diagnosis not present

## 2019-04-17 DIAGNOSIS — N529 Male erectile dysfunction, unspecified: Secondary | ICD-10-CM

## 2019-04-17 LAB — POCT GLYCOSYLATED HEMOGLOBIN (HGB A1C): Hemoglobin A1C: 7.2 % — AB (ref 4.0–5.6)

## 2019-04-17 NOTE — Progress Notes (Signed)
  Subjective:    Patient ID: Maxwell Sanders, male    DOB: 07/16/1941, 78 y.o.   MRN: [id number]  Maxwell Sanders is a 78 y.o. male who presents for follow-up of Type 2 diabetes mellitus.  Patient is checking home blood sugars.   Home blood sugar records: write down  How often is blood sugars being checked: twice a week 113-175 fasting and some after a meal Current symptoms/problems include none at this time. Daily foot checks: yes   Any foot concerns: strange sensation on bottom Last eye exam: nov, 2019 Exercise: some evercise He continues on metformin.  He is also taking his blood pressure medications as well as a statin.  He has not needed the Cialis recently.  He does see his dermatologist regularly.  In general things are going quite well for him.   The following portions of the patient's history were reviewed and updated as appropriate: allergies, current medications, past medical history, past social history and problem list.  ROS as in subjective above.     Objective:    Physical Exam Alert and in no distress otherwise not examined.  Blood pressure 140/84, pulse 78, temperature 99.5 F (37.5 C), weight 175 lb (79.4 kg), SpO2 96 %.  Lab Review Diabetic Labs Latest Ref Rng & Units 12/12/2018 08/02/2018 04/12/2018 11/24/2017 07/27/2017  HbA1c 4.0 - 5.6 % 7.2(A) 6.9(A) 6.7(A) 7.3 7.1  Microalbumin mg/L - - - - 7.5  Micro/Creat Ratio - - - - - 12.6  Chol 100 - 199 mg/dL - 148 - - 143  HDL >39 mg/dL - 55 - - 55  Calc LDL 0 - 99 mg/dL - 77 - - 70  Triglycerides 0 - 149 mg/dL - 78 - - 92  Creatinine 0.76 - 1.27 mg/dL - 0.75(L) - - 0.86   BP/Weight 04/17/2019 12/12/2018 08/02/2018 5/62/1308 04/26/7845  Systolic BP 962 952 841 324 401  Diastolic BP 84 80 72 78 70  Wt. (Lbs) 175 176.6 172.2 175.6 182.8  BMI 26.61 26.85 26.18 26.9 27.79   Foot/eye exam completion dates Latest Ref Rng & Units 09/28/2018 08/02/2018  Eye Exam No Retinopathy No Retinopathy -  Foot Form Completion - - Done   Hemoglobin A1c is 7.2 Maxwell Sanders  reports that he has quit smoking. He has never used smokeless tobacco. He reports that he does not drink alcohol or use drugs.     Assessment & Plan:    Erectile dysfunction, unspecified erectile dysfunction type  Hyperlipidemia associated with type 2 diabetes mellitus ([place])  Hypertension associated with diabetes ([place])  Skin cancer of scalp  Type 2 diabetes with complication ([place])   1. Rx changes: none 2. Education: Reviewed 'ABCs' of diabetes management (respective goals in parentheses):  A1C (<7), blood pressure (<130/80), and cholesterol (LDL <100). 3. Compliance at present is estimated to be excellent. Efforts to improve compliance (if necessary) will be directed at the same. 4. Follow up: 4 months Discussed proper care for COVID prevention.

## 2019-04-17 NOTE — Addendum Note (Signed)
Addended by: Elyse Jarvis on: 04/17/2019 11:24 AM   Modules accepted: Orders

## 2019-06-21 ENCOUNTER — Other Ambulatory Visit: Payer: Self-pay | Admitting: Family Medicine

## 2019-06-21 DIAGNOSIS — E118 Type 2 diabetes mellitus with unspecified complications: Secondary | ICD-10-CM

## 2019-07-23 ENCOUNTER — Other Ambulatory Visit: Payer: Self-pay | Admitting: Family Medicine

## 2019-07-23 DIAGNOSIS — E785 Hyperlipidemia, unspecified: Secondary | ICD-10-CM

## 2019-07-23 DIAGNOSIS — E1159 Type 2 diabetes mellitus with other circulatory complications: Secondary | ICD-10-CM

## 2019-07-23 DIAGNOSIS — E1169 Type 2 diabetes mellitus with other specified complication: Secondary | ICD-10-CM

## 2019-07-23 DIAGNOSIS — I152 Hypertension secondary to endocrine disorders: Secondary | ICD-10-CM

## 2019-08-09 DIAGNOSIS — D1801 Hemangioma of skin and subcutaneous tissue: Secondary | ICD-10-CM | POA: Diagnosis not present

## 2019-08-09 DIAGNOSIS — L812 Freckles: Secondary | ICD-10-CM | POA: Diagnosis not present

## 2019-08-09 DIAGNOSIS — D692 Other nonthrombocytopenic purpura: Secondary | ICD-10-CM | POA: Diagnosis not present

## 2019-08-09 DIAGNOSIS — Z85828 Personal history of other malignant neoplasm of skin: Secondary | ICD-10-CM | POA: Diagnosis not present

## 2019-08-09 DIAGNOSIS — L821 Other seborrheic keratosis: Secondary | ICD-10-CM | POA: Diagnosis not present

## 2019-08-09 DIAGNOSIS — L570 Actinic keratosis: Secondary | ICD-10-CM | POA: Diagnosis not present

## 2019-08-14 ENCOUNTER — Encounter: Payer: Self-pay | Admitting: Family Medicine

## 2019-08-14 ENCOUNTER — Ambulatory Visit (INDEPENDENT_AMBULATORY_CARE_PROVIDER_SITE_OTHER): Payer: Medicare HMO | Admitting: Family Medicine

## 2019-08-14 ENCOUNTER — Other Ambulatory Visit: Payer: Self-pay

## 2019-08-14 ENCOUNTER — Telehealth (HOSPITAL_COMMUNITY): Payer: Self-pay | Admitting: Rehabilitation

## 2019-08-14 ENCOUNTER — Telehealth: Payer: Self-pay

## 2019-08-14 VITALS — BP 130/82 | HR 63 | Temp 96.9°F | Ht 68.0 in | Wt 175.8 lb

## 2019-08-14 DIAGNOSIS — I10 Essential (primary) hypertension: Secondary | ICD-10-CM

## 2019-08-14 DIAGNOSIS — E1121 Type 2 diabetes mellitus with diabetic nephropathy: Secondary | ICD-10-CM | POA: Diagnosis not present

## 2019-08-14 DIAGNOSIS — Z136 Encounter for screening for cardiovascular disorders: Secondary | ICD-10-CM | POA: Diagnosis not present

## 2019-08-14 DIAGNOSIS — E118 Type 2 diabetes mellitus with unspecified complications: Secondary | ICD-10-CM

## 2019-08-14 DIAGNOSIS — I152 Hypertension secondary to endocrine disorders: Secondary | ICD-10-CM

## 2019-08-14 DIAGNOSIS — E1169 Type 2 diabetes mellitus with other specified complication: Secondary | ICD-10-CM

## 2019-08-14 DIAGNOSIS — M79605 Pain in left leg: Secondary | ICD-10-CM

## 2019-08-14 DIAGNOSIS — Z23 Encounter for immunization: Secondary | ICD-10-CM

## 2019-08-14 DIAGNOSIS — M79604 Pain in right leg: Secondary | ICD-10-CM

## 2019-08-14 DIAGNOSIS — E785 Hyperlipidemia, unspecified: Secondary | ICD-10-CM

## 2019-08-14 DIAGNOSIS — E1159 Type 2 diabetes mellitus with other circulatory complications: Secondary | ICD-10-CM

## 2019-08-14 DIAGNOSIS — J302 Other seasonal allergic rhinitis: Secondary | ICD-10-CM

## 2019-08-14 DIAGNOSIS — C4440 Unspecified malignant neoplasm of skin of scalp and neck: Secondary | ICD-10-CM

## 2019-08-14 DIAGNOSIS — R972 Elevated prostate specific antigen [PSA]: Secondary | ICD-10-CM | POA: Diagnosis not present

## 2019-08-14 DIAGNOSIS — N529 Male erectile dysfunction, unspecified: Secondary | ICD-10-CM | POA: Diagnosis not present

## 2019-08-14 DIAGNOSIS — D692 Other nonthrombocytopenic purpura: Secondary | ICD-10-CM

## 2019-08-14 LAB — POCT GLYCOSYLATED HEMOGLOBIN (HGB A1C): Hemoglobin A1C: 7.6 % — AB (ref 4.0–5.6)

## 2019-08-14 LAB — POCT UA - MICROALBUMIN
Albumin/Creatinine Ratio, Urine, POC: 46.2
Creatinine, POC: 48.9 mg/dL
Microalbumin Ur, POC: 22.6 mg/L

## 2019-08-14 MED ORDER — SIMVASTATIN 20 MG PO TABS: 20.0000 mg | ORAL_TABLET | Freq: Every day | ORAL | 3 refills | Status: DC

## 2019-08-14 MED ORDER — LISINOPRIL-HYDROCHLOROTHIAZIDE 20-12.5 MG PO TABS: 1.0000 | ORAL_TABLET | Freq: Every day | ORAL | 3 refills | Status: DC

## 2019-08-14 MED ORDER — METFORMIN HCL 500 MG PO TABS: 500.0000 mg | ORAL_TABLET | Freq: Two times a day (BID) | ORAL | 1 refills | Status: DC

## 2019-08-14 MED ORDER — METOPROLOL TARTRATE 100 MG PO TABS: 100.0000 mg | ORAL_TABLET | Freq: Two times a day (BID) | ORAL | 3 refills | Status: DC

## 2019-08-14 NOTE — Telephone Encounter (Signed)
I sent a staff message to the provider seeking a stronger dianosis for scheduling this patient for ultrasound ordered by Dr. Redmond School. Awaiting a reply.

## 2019-08-14 NOTE — Progress Notes (Signed)
Maxwell Sanders is a 78 y.o. male who presents for annual wellness visit and follow-up on chronic medical conditions.  He has diabetes and does check his blood sugars periodically.  He usually run below 165.  He continues on metformin without difficulty.  He checks his feet regularly and is taking good care of them.  He has an eye exam set up for November.  Does have underlying ED but presently is not on any medications and is not interested in any.  He recently saw a dermatologist and did have several lesions frozen.  He does have a history of elevated PSA and on 2 occasions has had biopsies which were negative.  His allergies are under good control.  Presently he is not taking any medications.  He continues on lisinopril/HCTZ and metoprolol and having no problems with them.  No chest pain, shortness of breath, PND.  Continues on simvastatin and is having no aches or pains.  He also takes an aspirin every day.   Immunizations and Health Maintenance Immunization History  Administered Date(s) Administered  . DT 11/07/2002  . Influenza, High Dose Seasonal PF 12/02/2015, 07/22/2016, 07/27/2017, 12/12/2018  . Influenza, Seasonal, Injecte, Preservative Fre 12/07/2012  . Pneumococcal Conjugate-13 06/21/2013  . Pneumococcal Polysaccharide-23 05/11/2006  . Tdap 04/21/2012  . Zoster 03/14/2008  . Zoster Recombinat (Shingrix) 04/10/2018, 06/17/2018   Health Maintenance Due  Topic Date Due  . INFLUENZA VACCINE  06/23/2019  . FOOT EXAM  08/03/2019    Last colonoscopy:08-05-15 Last PSA: 08-02-18 Dentist: Q six months Ophtho: 11/19 Exercise: walking , yard work   Other doctors caring for patient include: Marlon Pel Advanced Directives: on  file    Depression screen:  See questionnaire below.     Depression screen [facility] 2/9 08/14/2019 08/02/2018 07/27/2017 07/22/2016 07/11/2015  Decreased Interest 0 0 0 0 0  Down, Depressed, Hopeless 0 0 0 0 0  PHQ - 2 Score 0 0 0 0 0    Fall Screen: See Questionaire  below.   Fall Risk  08/14/2019 08/02/2018 07/27/2017 07/22/2016 07/11/2015  Falls in the past year? 0 No No No No    ADL screen:  See questionnaire below.  Functional Status Survey: Is the patient deaf or have difficulty hearing?: No Does the patient have difficulty seeing, even when wearing glasses/contacts?: No Does the patient have difficulty concentrating, remembering, or making decisions?: No Does the patient have difficulty walking or climbing stairs?: No Does the patient have difficulty dressing or bathing?: No Does the patient have difficulty doing errands alone such as visiting a doctor's office or shopping?: No   Review of Systems  Constitutional: -, -unexpected weight change, -anorexia, -fatigue Allergy: -sneezing, -itching, -congestion Dermatology: denies changing moles, rash, lumps ENT: -runny nose, -ear pain, -sore throat,  Cardiology:  -chest pain, -palpitations, -orthopnea, Respiratory: -cough, -shortness of breath, -dyspnea on exertion, -wheezing,  Gastroenterology: -abdominal pain, -nausea, -vomiting, -diarrhea, -constipation, -dysphagia Hematology: -bleeding or bruising problems Musculoskeletal: -arthralgias, -myalgias, -joint swelling, -back pain, - Ophthalmology: -vision changes,  Urology: -dysuria, -difficulty urinating,  -urinary frequency, -urgency, incontinence Neurology: -, -numbness, , -memory loss, -falls, -dizziness    PHYSICAL EXAM:  BP 130/82 (BP Location: Left Arm, Patient Position: Sitting)   Pulse 63   Temp (!) 96.9 F (36.1 C)   Ht 5\' 8"  (1.727 m)   Wt 175 lb 12.8 oz (79.7 kg)   SpO2 97%   BMI 26.73 kg/m   General Appearance: Alert, cooperative, no distress, appears stated age Head: Normocephalic,  without obvious abnormality, atraumatic Eyes: PERRL, conjunctiva/corneas clear, EOM's intact, fundi benign Ears: Normal TM's and external ear canals Nose: Nares normal, mucosa normal, no drainage or sinus   tenderness Throat: Lips, mucosa, and  tongue normal; teeth and gums normal Neck: Supple, no lymphadenopathy, thyroid:no enlargement/tenderness/nodules; no carotid bruit or JVD Lungs: Clear to auscultation bilaterally without wheezes, rales or ronchi; respirations unlabored Heart: Regular rate and rhythm, S1 and S2 normal, no murmur, rub or gallop Extremities: No clubbing, cyanosis or edema.  Foot exam is recorded and is normal Pulses: 2+ and symmetric all extremities Skin: purpuric lesions noted on forearms Lymph nodes: Cervical, supraclavicular, and axillary nodes normal Neurologic: CNII-XII intact, normal strength, sensation and gait; reflexes 2+ and symmetric throughout   Psych: Normal mood, affect, hygiene and grooming Hemoglobin A1c is 7.6 A/C ratio is 46 ASSESSMENT/PLAN: Need for influenza vaccination - Plan: Flu Vaccine QUAD High Dose(Fluad)  Hyperlipidemia associated with type 2 diabetes mellitus ([place]) - Plan: simvastatin (ZOCOR) 20 MG tablet, POCT UA - Microalbumin  Hypertension associated with diabetes ([place]) - Plan: metoprolol tartrate (LOPRESSOR) 100 MG tablet, lisinopril-hydrochlorothiazide (ZESTORETIC) 20-12.5 MG tablet, CBC with Differential/Platelet, Comprehensive metabolic panel  Type 2 diabetes mellitus with complication, without long-term current use of insulin ([place]) - Plan: metFORMIN (GLUCOPHAGE) 500 MG tablet, CBC with Differential/Platelet, Comprehensive metabolic panel, Lipid panel, POCT UA - Microalbumin, VAS [place] ABI WITH/WO TBI  Encounter for screening for vascular disease - Plan: VAS [place] ABI WITH/WO TBI  Type 2 diabetes with complication (HCC)  Erectile dysfunction, unspecified erectile dysfunction type  Elevated PSA  Seasonal allergic rhinitis, unspecified trigger  Senile purpura (HCC)  Skin cancer of scalp He will continue on his present medications.  At this he is not interested in Cialis.    I also discussed his PSA and again at this point he is fine with not pursuing this.  Immunization  recommendations discussed.    Medicare Attestation I have personally reviewed: The patient's medical and social history Their use of alcohol, tobacco or illicit drugs Their current medications and supplements The patient's functional ability including ADLs,fall risks, home safety risks, cognitive, and hearing and visual impairment Diet and physical activities Evidence for depression or mood disorders  The patient's weight, height, and BMI have been recorded in the chart.  I have made referrals, counseling, and provided education to the patient based on review of the above and I have provided the patient with a written personalized care plan for preventive services.     Jill Alexanders, MD   08/14/2019

## 2019-08-14 NOTE — Telephone Encounter (Signed)
Calledf [facility] to confirm DX for pt to have a ABI . LVM for lab to call poffice back . [place]

## 2019-08-14 NOTE — Telephone Encounter (Signed)
As part of follow-up for this patient, I did leave a message with Dr. Lanice Shirts office Joelene [place]in) to further ask for a diagnosis that would better warrant the ultrasound that was ordered. Currently awaiting a call back.

## 2019-08-14 NOTE — Patient Instructions (Signed)
  [name] , Thank you for taking time to come for your Medicare Wellness Visit. I appreciate your ongoing commitment to your health goals. Please review the following plan we discussed and let me know if I can assist you in the future.   These are the goals we discussed: Continue to take good care of yourself. This is a list of the screening recommended for you and due dates:  Health Maintenance  Topic Date Due  . Flu Shot  06/23/2019  . Complete foot exam   08/03/2019  . Eye exam for diabetics  09/29/2019  . Hemoglobin A1C  10/18/2019  . Tetanus Vaccine  04/21/2022  . Pneumonia vaccines  Completed

## 2019-08-15 ENCOUNTER — Telehealth: Payer: Self-pay

## 2019-08-15 LAB — CBC WITH DIFFERENTIAL/PLATELET
Basophils Absolute: 0.1 10*3/uL (ref 0.0–0.2)
Basos: 1 %
EOS (ABSOLUTE): 0.2 10*3/uL (ref 0.0–0.4)
Eos: 2 %
Hematocrit: 42.8 % (ref 37.5–51.0)
Hemoglobin: 14.5 g/dL (ref 13.0–17.7)
Immature Grans (Abs): 0 10*3/uL (ref 0.0–0.1)
Immature Granulocytes: 0 %
Lymphocytes Absolute: 1.6 10*3/uL (ref 0.7–3.1)
Lymphs: 17 %
MCH: 32.2 pg (ref 26.6–33.0)
MCHC: 33.9 g/dL (ref 31.5–35.7)
MCV: 95 fL (ref 79–97)
Monocytes Absolute: 1 10*3/uL — ABNORMAL HIGH (ref 0.1–0.9)
Monocytes: 11 %
Neutrophils Absolute: 6.4 10*3/uL (ref 1.4–7.0)
Neutrophils: 69 %
Platelets: 254 10*3/uL (ref 150–450)
RBC: 4.5 x10E6/uL (ref 4.14–5.80)
RDW: 11.8 % (ref 11.6–15.4)
WBC: 9.4 10*3/uL (ref 3.4–10.8)

## 2019-08-15 LAB — COMPREHENSIVE METABOLIC PANEL
ALT: 19 IU/L (ref 0–44)
AST: 22 IU/L (ref 0–40)
Albumin/Globulin Ratio: 2.1 (ref 1.2–2.2)
Albumin: 4.7 g/dL (ref 3.7–4.7)
Alkaline Phosphatase: 84 IU/L (ref 39–117)
BUN/Creatinine Ratio: 21 (ref 10–24)
BUN: 16 mg/dL (ref 8–27)
Bilirubin Total: 0.5 mg/dL (ref 0.0–1.2)
CO2: 24 mmol/L (ref 20–29)
Calcium: 9.9 mg/dL (ref 8.6–10.2)
Chloride: 100 mmol/L (ref 96–106)
Creatinine, Ser: 0.78 mg/dL (ref 0.76–1.27)
GFR calc Af Amer: 99 mL/min/{1.73_m2} (ref >59)
GFR calc non Af Amer: 86 mL/min/{1.73_m2} (ref >59)
Globulin, Total: 2.2 g/dL (ref 1.5–4.5)
Glucose: 148 mg/dL — ABNORMAL HIGH (ref 65–99)
Potassium: 4.6 mmol/L (ref 3.5–5.2)
Sodium: 138 mmol/L (ref 134–144)
Total Protein: 6.9 g/dL (ref 6.0–8.5)

## 2019-08-15 LAB — LIPID PANEL
Chol/HDL Ratio: 2.6 ratio (ref 0.0–5.0)
Cholesterol, Total: 143 mg/dL (ref 100–199)
HDL: 55 mg/dL (ref >39)
LDL Chol Calc (NIH): 74 mg/dL (ref 0–99)
Triglycerides: 71 mg/dL (ref 0–149)
VLDL Cholesterol Cal: 14 mg/dL (ref 5–40)

## 2019-08-15 NOTE — Telephone Encounter (Signed)
Crystal from [facility] and she advised that we can use PAD, Leg pain, PVD, clotting for dx on his ABI. Please advise [facility]

## 2019-08-20 ENCOUNTER — Telehealth (HOSPITAL_COMMUNITY): Payer: Self-pay | Admitting: *Deleted

## 2019-08-20 NOTE — Telephone Encounter (Signed)
The above patient or their representative was contacted and gave the following answers to these questions:         Do you have any of the following symptoms?n  Fever                    Cough                   Shortness of breath  Do  you have any of the following other symptoms? n   muscle pain         vomiting,        diarrhea        rash         weakness        red eye        abdominal pain         bruising          bruising or bleeding              joint pain           severe headache    Have you been in contact with someone who was or has been sick in the past 2 weeks?n Yes                 Unsure                         Unable to assess   Does the person that you were in contact with have any of the following symptoms?   Cough         shortness of breath           muscle pain         vomiting,            diarrhea            rash            weakness           fever            red eye           abdominal pain           bruising  or  bleeding                joint pain                severe headache               Have you  or someone you have been in contact with traveled internationally in th last month?         If yes, which countries?   Have you  or someone you have been in contact with traveled outside [place] in th last month?         If yes, which state and city?   COMMENTS OR ACTION PLAN FOR THIS PATIENT:         qu 

## 2019-08-21 ENCOUNTER — Ambulatory Visit (HOSPITAL_COMMUNITY)
Admission: RE | Admit: 2019-08-21 | Discharge: 2019-08-21 | Disposition: A | Discharge status: Home / Self Care | Payer: Medicare HMO | Source: Ambulatory Visit | Attending: Family Medicine | Admitting: Family Medicine

## 2019-08-21 ENCOUNTER — Other Ambulatory Visit: Payer: Self-pay

## 2019-08-21 DIAGNOSIS — M79605 Pain in left leg: Secondary | ICD-10-CM | POA: Diagnosis not present

## 2019-08-21 DIAGNOSIS — Z136 Encounter for screening for cardiovascular disorders: Secondary | ICD-10-CM

## 2019-08-21 DIAGNOSIS — M79604 Pain in right leg: Secondary | ICD-10-CM

## 2019-08-21 DIAGNOSIS — E118 Type 2 diabetes mellitus with unspecified complications: Secondary | ICD-10-CM | POA: Diagnosis not present

## 2019-09-26 DIAGNOSIS — R69 Illness, unspecified: Secondary | ICD-10-CM | POA: Diagnosis not present

## 2019-11-07 DIAGNOSIS — Z961 Presence of intraocular lens: Secondary | ICD-10-CM | POA: Diagnosis not present

## 2019-11-07 DIAGNOSIS — H04123 Dry eye syndrome of bilateral lacrimal glands: Secondary | ICD-10-CM | POA: Diagnosis not present

## 2019-11-07 DIAGNOSIS — E119 Type 2 diabetes mellitus without complications: Secondary | ICD-10-CM | POA: Diagnosis not present

## 2019-11-07 LAB — HM DIABETES EYE EXAM

## 2019-12-18 ENCOUNTER — Ambulatory Visit (INDEPENDENT_AMBULATORY_CARE_PROVIDER_SITE_OTHER): Payer: Medicare HMO | Admitting: Family Medicine

## 2019-12-18 ENCOUNTER — Other Ambulatory Visit: Payer: Self-pay

## 2019-12-18 ENCOUNTER — Encounter: Payer: Self-pay | Admitting: Family Medicine

## 2019-12-18 VITALS — BP 148/72 | HR 78 | Temp 96.0°F | Wt 169.8 lb

## 2019-12-18 DIAGNOSIS — I10 Essential (primary) hypertension: Secondary | ICD-10-CM

## 2019-12-18 DIAGNOSIS — N528 Other male erectile dysfunction: Secondary | ICD-10-CM

## 2019-12-18 DIAGNOSIS — E1169 Type 2 diabetes mellitus with other specified complication: Secondary | ICD-10-CM

## 2019-12-18 DIAGNOSIS — N401 Enlarged prostate with lower urinary tract symptoms: Secondary | ICD-10-CM

## 2019-12-18 DIAGNOSIS — E118 Type 2 diabetes mellitus with unspecified complications: Secondary | ICD-10-CM

## 2019-12-18 DIAGNOSIS — E785 Hyperlipidemia, unspecified: Secondary | ICD-10-CM

## 2019-12-18 DIAGNOSIS — E1159 Type 2 diabetes mellitus with other circulatory complications: Secondary | ICD-10-CM | POA: Diagnosis not present

## 2019-12-18 DIAGNOSIS — I152 Hypertension secondary to endocrine disorders: Secondary | ICD-10-CM

## 2019-12-18 LAB — POCT GLYCOSYLATED HEMOGLOBIN (HGB A1C): Hemoglobin A1C: 7.2 % — AB (ref 4.0–5.6)

## 2019-12-18 MED ORDER — TADALAFIL 20 MG PO TABS: 20.0000 mg | ORAL_TABLET | ORAL | 1 refills | Status: DC

## 2019-12-18 NOTE — Progress Notes (Signed)
  Subjective:    Patient ID: Maxwell Sanders, male    DOB: [birth year], 79 y.o.   MRN: [id number]  Maxwell Sanders is a 79 y.o. male who presents for follow-up of Type 2 diabetes mellitus.  Home blood sugar records: meter records fasting 130- 165 Current symptoms/problems include none at this time. Daily foot checks:yes   Any foot concerns: none Exercise: working out through regular daily activities. Diet: healthy  He would like a refill on his Cialis.  He does have a history of BPH.  He also needs new test strips.  He continues on lisinopril/HCTZ and metoprolol and is having no difficulty with that.  Simvastatin is causing no trouble.  He is also taking Metformin and having no GI issues with that. The following portions of the patient's history were reviewed and updated as appropriate: allergies, current medications, past medical history, past social history and problem list.  ROS as in subjective above.     Objective:    Physical Exam Alert and in no distress otherwise not examined. Hemoglobin A1c is 7.1   Lab Review Diabetic Labs Latest Ref Rng & Units 12/18/2019 08/14/2019 04/17/2019 12/12/2018 08/02/2018  HbA1c 4.0 - 5.6 % 7.2(A) 7.6(A) 7.2(A) 7.2(A) 6.9(A)  Microalbumin mg/L - 22.6 - - -  Micro/Creat Ratio - - 46.2 - - -  Chol 100 - 199 mg/dL - 143 - - 148  HDL >39 mg/dL - 55 - - 55  Calc LDL 0 - 99 mg/dL - 74 - - 77  Triglycerides 0 - 149 mg/dL - 71 - - 78  Creatinine 0.76 - 1.27 mg/dL - 0.78 - - 0.75(L)   BP/Weight 12/18/2019 08/14/2019 04/17/2019 12/12/2018 [id number]  Systolic BP [id number]  Diastolic BP 72 82 84 80 72  Wt. (Lbs) 169.8 175.8 175 176.6 172.2  BMI 25.82 26.73 26.61 26.85 26.18   Foot/eye exam completion dates Latest Ref Rng & Units 11/07/2019 08/14/2019  Eye Exam No Retinopathy No Retinopathy -  Foot Form Completion - - Done    Maxwell Sanders  reports that he has quit smoking. He has never used smokeless tobacco. He reports that he does not drink alcohol or use  drugs.     Assessment & Plan:    Type 2 diabetes mellitus with complication, without long-term current use of insulin (HCC) - Plan: POCT glycosylated hemoglobin (Hb A1C)  Type 2 diabetes with complication (HCC)  Hypertension associated with diabetes ([place])  Hyperlipidemia associated with type 2 diabetes mellitus ([place])  Other male erectile dysfunction - Plan: tadalafil (CIALIS) 20 MG tablet  Benign prostatic hyperplasia with lower urinary tract symptoms, symptom details unspecified - Plan: tadalafil (CIALIS) 20 MG tablet   1. Rx changes: none 2. Education: Reviewed 'ABCs' of diabetes management (respective goals in parentheses):  A1C (<7), blood pressure (<130/80), and cholesterol (LDL <100). 3. Compliance at present is estimated to be good. Efforts to improve compliance (if necessary) will be directed at Continue with diet and exercise. 4. Follow up: 4 months

## 2019-12-27 DIAGNOSIS — R69 Illness, unspecified: Secondary | ICD-10-CM | POA: Diagnosis not present

## 2020-01-17 DIAGNOSIS — R69 Illness, unspecified: Secondary | ICD-10-CM | POA: Diagnosis not present

## 2020-02-07 DIAGNOSIS — D485 Neoplasm of uncertain behavior of skin: Secondary | ICD-10-CM | POA: Diagnosis not present

## 2020-02-07 DIAGNOSIS — L821 Other seborrheic keratosis: Secondary | ICD-10-CM | POA: Diagnosis not present

## 2020-02-07 DIAGNOSIS — Z85828 Personal history of other malignant neoplasm of skin: Secondary | ICD-10-CM | POA: Diagnosis not present

## 2020-02-07 DIAGNOSIS — C44722 Squamous cell carcinoma of skin of right lower limb, including hip: Secondary | ICD-10-CM | POA: Diagnosis not present

## 2020-02-07 DIAGNOSIS — L812 Freckles: Secondary | ICD-10-CM | POA: Diagnosis not present

## 2020-02-07 DIAGNOSIS — L570 Actinic keratosis: Secondary | ICD-10-CM | POA: Diagnosis not present

## 2020-02-08 HISTORY — PX: SKIN BIOPSY: SHX1

## 2020-02-26 ENCOUNTER — Telehealth: Payer: Self-pay | Admitting: Family Medicine

## 2020-02-26 NOTE — Telephone Encounter (Signed)
Pt called is requesting a refill on one touch verio strips and lancets please send to the [place], [place]

## 2020-02-27 ENCOUNTER — Other Ambulatory Visit: Payer: Self-pay

## 2020-02-27 DIAGNOSIS — R69 Illness, unspecified: Secondary | ICD-10-CM | POA: Diagnosis not present

## 2020-02-27 MED ORDER — ONETOUCH DELICA LANCETS 33G MISC: 1.0000 | Freq: Every day | 3 refills | Status: DC

## 2020-02-27 MED ORDER — GLUCOSE BLOOD VI STRP: 1.0000 | ORAL_STRIP | 3 refills | Status: DC | PRN

## 2020-02-27 NOTE — Telephone Encounter (Signed)
Pt was advised and script sent [facility]

## 2020-02-28 ENCOUNTER — Encounter: Payer: Self-pay | Admitting: Family Medicine

## 2020-03-14 ENCOUNTER — Other Ambulatory Visit: Payer: Self-pay | Admitting: Family Medicine

## 2020-03-14 DIAGNOSIS — E118 Type 2 diabetes mellitus with unspecified complications: Secondary | ICD-10-CM

## 2020-04-02 DIAGNOSIS — R69 Illness, unspecified: Secondary | ICD-10-CM | POA: Diagnosis not present

## 2020-04-23 ENCOUNTER — Ambulatory Visit (INDEPENDENT_AMBULATORY_CARE_PROVIDER_SITE_OTHER): Payer: Medicare HMO | Admitting: Family Medicine

## 2020-04-23 ENCOUNTER — Encounter: Payer: Self-pay | Admitting: Family Medicine

## 2020-04-23 ENCOUNTER — Other Ambulatory Visit: Payer: Self-pay

## 2020-04-23 VITALS — BP 158/80 | HR 70 | Temp 98.0°F | Wt 162.4 lb

## 2020-04-23 DIAGNOSIS — E785 Hyperlipidemia, unspecified: Secondary | ICD-10-CM | POA: Diagnosis not present

## 2020-04-23 DIAGNOSIS — E118 Type 2 diabetes mellitus with unspecified complications: Secondary | ICD-10-CM | POA: Diagnosis not present

## 2020-04-23 DIAGNOSIS — E1169 Type 2 diabetes mellitus with other specified complication: Secondary | ICD-10-CM | POA: Diagnosis not present

## 2020-04-23 DIAGNOSIS — J302 Other seasonal allergic rhinitis: Secondary | ICD-10-CM

## 2020-04-23 DIAGNOSIS — N401 Enlarged prostate with lower urinary tract symptoms: Secondary | ICD-10-CM

## 2020-04-23 DIAGNOSIS — E1159 Type 2 diabetes mellitus with other circulatory complications: Secondary | ICD-10-CM

## 2020-04-23 DIAGNOSIS — E1121 Type 2 diabetes mellitus with diabetic nephropathy: Secondary | ICD-10-CM | POA: Diagnosis not present

## 2020-04-23 DIAGNOSIS — I152 Hypertension secondary to endocrine disorders: Secondary | ICD-10-CM

## 2020-04-23 DIAGNOSIS — N528 Other male erectile dysfunction: Secondary | ICD-10-CM

## 2020-04-23 DIAGNOSIS — I10 Essential (primary) hypertension: Secondary | ICD-10-CM

## 2020-04-23 LAB — POCT GLYCOSYLATED HEMOGLOBIN (HGB A1C): Hemoglobin A1C: 7.2 % — AB (ref 4.0–5.6)

## 2020-04-23 NOTE — Addendum Note (Signed)
Addended by: Elyse Jarvis on: 04/23/2020 10:52 AM   Modules accepted: Orders

## 2020-04-23 NOTE — Progress Notes (Signed)
  Subjective:    Patient ID: Maxwell Sanders, male    DOB: September 27, 1941, 79 y.o.   MRN: [id number]  Maxwell Sanders is a 79 y.o. male who presents for follow-up of Type 2 diabetes mellitus.  Home blood sugar records: meter record, fasting and post meal 103- 184 Current symptoms/problems include none at this time . Daily foot checks:yes   Any foot concerns:none Exercise: staying active Diet: good  He is scheduled to see ophthalmology in November.  Previous urine screens does show evidence of nephropathy.  He continues on Cialis to help with his BPH symptoms but is not sure whether it is really helping.  He also sees dermatology for skin lesions.  He continues on simvastatin and is having no aches or pains with that.  Metformin is causing no trouble.  Continues on lisinopril/HCTZ with no swelling or cough.  He also takes metoprolol. The following portions of the patient's history were reviewed and updated as appropriate: allergies, current medications, past medical history, past social history and problem list.  ROS as in subjective above.     Objective:    Physical Exam Alert and in no distress otherwise not examined. Hb A1C 7.2   Lab Review Diabetic Labs Latest Ref Rng & Units 12/18/2019 08/14/2019 04/17/2019 12/12/2018 08/02/2018  HbA1c 4.0 - 5.6 % 7.2(A) 7.6(A) 7.2(A) 7.2(A) 6.9(A)  Microalbumin mg/L - 22.6 - - -  Micro/Creat Ratio - - 46.2 - - -  Chol 100 - 199 mg/dL - 143 - - 148  HDL >39 mg/dL - 55 - - 55  Calc LDL 0 - 99 mg/dL - 74 - - 77  Triglycerides 0 - 149 mg/dL - 71 - - 78  Creatinine 0.76 - 1.27 mg/dL - 0.78 - - 0.75(L)   BP/Weight 12/18/2019 08/14/2019 04/17/2019 12/12/2018 [id number]  Systolic BP 123456 [id number]  Diastolic BP 72 82 84 80 72  Wt. (Lbs) 169.8 175.8 175 176.6 172.2  BMI 25.82 26.73 26.61 26.85 26.18   Foot/eye exam completion dates Latest Ref Rng & Units 11/07/2019 08/14/2019  Eye Exam No Retinopathy No Retinopathy -  Foot Form Completion - - Done     Maxwell Sanders  reports that he has quit smoking. He has never used smokeless tobacco. He reports that he does not drink alcohol or use drugs.     Assessment & Plan:    Type 2 diabetes mellitus with complication, without long-term current use of insulin (HCC)  Hypertension associated with diabetes ([place])  Hyperlipidemia associated with type 2 diabetes mellitus ([place])  Other male erectile dysfunction  Benign prostatic hyperplasia with lower urinary tract symptoms, symptom details unspecified    1. Rx changes: none 2. Education: Reviewed 'ABCs' of diabetes management (respective goals in parentheses):  A1C (<7), blood pressure (<130/80), and cholesterol (LDL <100). 3. Compliance at present is estimated to be good. Efforts to improve compliance (if necessary) will be directed at No change.. 4. Follow up: 4 months

## 2020-06-11 ENCOUNTER — Other Ambulatory Visit: Payer: Self-pay | Admitting: Family Medicine

## 2020-06-11 DIAGNOSIS — E118 Type 2 diabetes mellitus with unspecified complications: Secondary | ICD-10-CM

## 2020-08-11 DIAGNOSIS — L570 Actinic keratosis: Secondary | ICD-10-CM | POA: Diagnosis not present

## 2020-08-11 DIAGNOSIS — Z85828 Personal history of other malignant neoplasm of skin: Secondary | ICD-10-CM | POA: Diagnosis not present

## 2020-08-11 DIAGNOSIS — L820 Inflamed seborrheic keratosis: Secondary | ICD-10-CM | POA: Diagnosis not present

## 2020-08-11 DIAGNOSIS — L821 Other seborrheic keratosis: Secondary | ICD-10-CM | POA: Diagnosis not present

## 2020-08-11 DIAGNOSIS — D1801 Hemangioma of skin and subcutaneous tissue: Secondary | ICD-10-CM | POA: Diagnosis not present

## 2020-08-11 DIAGNOSIS — D692 Other nonthrombocytopenic purpura: Secondary | ICD-10-CM | POA: Diagnosis not present

## 2020-08-14 ENCOUNTER — Ambulatory Visit (INDEPENDENT_AMBULATORY_CARE_PROVIDER_SITE_OTHER): Payer: Medicare HMO | Admitting: Family Medicine

## 2020-08-14 ENCOUNTER — Encounter: Payer: Self-pay | Admitting: Family Medicine

## 2020-08-14 ENCOUNTER — Other Ambulatory Visit: Payer: Self-pay

## 2020-08-14 VITALS — BP 160/74 | HR 58 | Temp 98.0°F | Ht 67.0 in | Wt 160.8 lb

## 2020-08-14 DIAGNOSIS — R6889 Other general symptoms and signs: Secondary | ICD-10-CM

## 2020-08-14 DIAGNOSIS — J302 Other seasonal allergic rhinitis: Secondary | ICD-10-CM | POA: Diagnosis not present

## 2020-08-14 DIAGNOSIS — I10 Essential (primary) hypertension: Secondary | ICD-10-CM

## 2020-08-14 DIAGNOSIS — E118 Type 2 diabetes mellitus with unspecified complications: Secondary | ICD-10-CM | POA: Diagnosis not present

## 2020-08-14 DIAGNOSIS — E1169 Type 2 diabetes mellitus with other specified complication: Secondary | ICD-10-CM | POA: Diagnosis not present

## 2020-08-14 DIAGNOSIS — D692 Other nonthrombocytopenic purpura: Secondary | ICD-10-CM

## 2020-08-14 DIAGNOSIS — H18413 Arcus senilis, bilateral: Secondary | ICD-10-CM

## 2020-08-14 DIAGNOSIS — I152 Hypertension secondary to endocrine disorders: Secondary | ICD-10-CM

## 2020-08-14 DIAGNOSIS — R972 Elevated prostate specific antigen [PSA]: Secondary | ICD-10-CM | POA: Diagnosis not present

## 2020-08-14 DIAGNOSIS — E1159 Type 2 diabetes mellitus with other circulatory complications: Secondary | ICD-10-CM | POA: Diagnosis not present

## 2020-08-14 DIAGNOSIS — E1121 Type 2 diabetes mellitus with diabetic nephropathy: Secondary | ICD-10-CM

## 2020-08-14 DIAGNOSIS — Z23 Encounter for immunization: Secondary | ICD-10-CM

## 2020-08-14 DIAGNOSIS — C4440 Unspecified malignant neoplasm of skin of scalp and neck: Secondary | ICD-10-CM | POA: Diagnosis not present

## 2020-08-14 DIAGNOSIS — E785 Hyperlipidemia, unspecified: Secondary | ICD-10-CM

## 2020-08-14 DIAGNOSIS — Z0000 Encounter for general adult medical examination without abnormal findings: Secondary | ICD-10-CM

## 2020-08-14 DIAGNOSIS — N401 Enlarged prostate with lower urinary tract symptoms: Secondary | ICD-10-CM

## 2020-08-14 LAB — POCT GLYCOSYLATED HEMOGLOBIN (HGB A1C): Hemoglobin A1C: 6.7 % — AB (ref 4.0–5.6)

## 2020-08-14 MED ORDER — LISINOPRIL-HYDROCHLOROTHIAZIDE 20-12.5 MG PO TABS: 1.0000 | ORAL_TABLET | Freq: Every day | ORAL | 3 refills | Status: DC

## 2020-08-14 MED ORDER — METOPROLOL TARTRATE 100 MG PO TABS: 100.0000 mg | ORAL_TABLET | Freq: Two times a day (BID) | ORAL | 3 refills | Status: DC

## 2020-08-14 MED ORDER — METFORMIN HCL 500 MG PO TABS: ORAL_TABLET | ORAL | 1 refills | Status: DC

## 2020-08-14 MED ORDER — SIMVASTATIN 20 MG PO TABS: 20.0000 mg | ORAL_TABLET | Freq: Every day | ORAL | 3 refills | Status: DC

## 2020-08-14 NOTE — Progress Notes (Signed)
Maxwell Sanders is a 79 y.o. male who presents for annual  wellness visit,CPE and follow-up on chronic medical conditions.  He does complain of difficulty with hearing out of the left ear especially. He does have diabetes and continues on Metformin having no difficulty with that. He is also taking metoprolol and lisinopril/HCTZ for his blood pressure. He continues on Cialis for erectile dysfunction. He would also like follow-up on his PSA. He does have a history of BPH but at this time is not interested in further evaluation and treatment. His allergies seem to be under good control with OTC medications. He follows up regularly with dermatology for skin cancer. He also has evidence of diabetic nephropathy,A/C ratio is 46.2 but no retinopathy. Previous ABI  was abnormal on the left   Immunizations and Health Maintenance Immunization History  Administered Date(s) Administered  . DT (Pediatric) 11/07/2002  . Fluad Quad(high Dose 65+) 08/14/2019  . Influenza, High Dose Seasonal PF 12/02/2015, 07/22/2016, 07/27/2017, 12/12/2018  . Influenza, Seasonal, Injecte, Preservative Fre 12/07/2012  . PFIZER SARS-COV-2 Vaccination 12/24/2019, 01/14/2020  . Pneumococcal Conjugate-13 06/21/2013  . Pneumococcal Polysaccharide-23 05/11/2006  . Tdap 04/21/2012  . Zoster 03/14/2008  . Zoster Recombinat (Shingrix) 04/10/2018, 06/17/2018   Health Maintenance Due  Topic Date Due  . INFLUENZA VACCINE  06/22/2020  . FOOT EXAM  08/13/2020    Last colonoscopy: 06/09/2005, cologuard 08/10/2018 Last PSA: 08/02/18 Dentist:Q six months Ophtho: q year Exercise: yard work   Other doctors caring for patient include: Dr. Katy Sanders eye Dr. Ronnald Sanders derma  Advanced Directives: Does Patient Have a Medical Advance Directive?: Yes Type of Advance Directive: Living will  Depression screen:  See questionnaire below.     Depression screen [facility] 2/9 08/14/2020 08/14/2019 08/02/2018 07/27/2017 07/22/2016  Decreased Interest 0 0 0 0 0  Down,  Depressed, Hopeless 0 0 0 0 0  PHQ - 2 Score 0 0 0 0 0    Fall Screen: See Questionaire below.   Fall Risk  08/14/2020 08/14/2019 08/02/2018 07/27/2017 07/22/2016  Falls in the past year? 0 0 No No No    ADL screen:  See questionnaire below.  Functional Status Survey: Is the patient deaf or have difficulty hearing?: Yes Does the patient have difficulty seeing, even when wearing glasses/contacts?: No Does the patient have difficulty concentrating, remembering, or making decisions?: No Does the patient have difficulty walking or climbing stairs?: No Does the patient have difficulty dressing or bathing?: No Does the patient have difficulty doing errands alone such as visiting a doctor's office or shopping?: No   Review of Systems  Constitutional: -, -unexpected weight change, -anorexia, -fatigue Allergy: -sneezing, -itching, -congestion Dermatology: denies changing moles, rash, lumps ENT: -runny nose, -ear pain, -sore throat,  Cardiology:  -chest pain, -palpitations, -orthopnea, Respiratory: -cough, -shortness of breath, -dyspnea on exertion, -wheezing,  Gastroenterology: -abdominal pain, -nausea, -vomiting, -diarrhea, -constipation, -dysphagia Hematology: -bleeding or bruising problems Musculoskeletal: -arthralgias, -myalgias, -joint swelling, -back pain, - Ophthalmology: -vision changes,  Urology: -dysuria, -difficulty urinating,  -urinary frequency, -urgency, incontinence Neurology: -, -numbness, , -memory loss, -falls, -dizziness    PHYSICAL EXAM:  General Appearance: Alert, cooperative, no distress, appears stated age Head: Normocephalic, without obvious abnormality, atraumatic Eyes: PERRL, conjunctiva clear, cornea shows evidence of arcus senilis EOM's intact,  Ears: Normal TM's and external ear canals after both ears were lavaged Nose: Nares normal, mucosa normal, no drainage or sinus   tenderness Throat: Lips, mucosa, and tongue normal; teeth and gums normal Neck: Supple,  no lymphadenopathy,  thyroid:no enlargement/tenderness/nodules; no carotid bruit or JVD Lungs: Clear to auscultation bilaterally without wheezes, rales or ronchi; respirations unlabored Heart: Regular rate and rhythm, S1 and S2 normal, no murmur, rub or gallop Abdomen: Soft, non-tender, nondistended, normoactive bowel sounds, no masses, no hepatosplenomegaly Extremities: No clubbing, cyanosis or edema Pulses: 2+ and symmetric all extremities Skin: Exam of the scalp does show hyper as well as hypopigmentation recent freezing of tissue. Exam of both arms does show evidence of purpuric lesions. Lymph nodes: Cervical, supraclavicular, and axillary nodes normal Neurologic: CNII-XII intact, normal strength, sensation and gait; reflexes 2+ and symmetric throughout   Psych: Normal mood, affect, hygiene and grooming Hemoglobin A1c is 6.7 ASSESSMENT/PLAN: Routine general medical examination at a health care facility  Immunization, viral disease - Plan: Pfizer SARS-COV-2 Vaccine  Type 2 diabetes mellitus with complication, without long-term current use of insulin (Maxwell Sanders) - Plan: Comprehensive metabolic panel, CBC with Differential/Platelet, POCT glycosylated hemoglobin (Hb A1C), metFORMIN (GLUCOPHAGE) 500 MG tablet  Hypertension associated with diabetes (Maxwell Sanders) - Plan: Comprehensive metabolic panel, CBC with Differential/Platelet, metoprolol tartrate (LOPRESSOR) 100 MG tablet, lisinopril-hydrochlorothiazide (ZESTORETIC) 20-12.5 MG tablet  Hyperlipidemia associated with type 2 diabetes mellitus (Maxwell Sanders) - Plan: POCT glycosylated hemoglobin (Hb A1C), simvastatin (ZOCOR) 20 MG tablet  Benign prostatic hyperplasia with lower urinary tract symptoms, symptom details unspecified  Seasonal allergic rhinitis, unspecified trigger  Diabetic nephropathy associated with type 2 diabetes mellitus (Maxwell Sanders)  Need for influenza vaccination - Plan: Flu Vaccine QUAD High Dose(Fluad)  Senile purpura (HCC)  Skin cancer of  scalp  Abnormal ankle brachial index (ABI) - Plan: DOPPLER ARTERIAL LEG LEFT  Elevated PSA - Plan: PSA  Arcus senilis of both corneas He will continue follow-up with dermatology for his skin lesions. No therapy for the senile purpura. Extremity exam was abnormal and previous ABI indicated need for further evaluation and possible referral. He will continue on his present medication regimen. Discussed the PSA with him and explained with his age what the risks were but he would still like to pursue this further. Continue to treat the allergies as needed.  Discussed PSA screening (risks/benefits), he would like another PSA done. Immunization recommendations discussed.  Colonoscopy recommendations reviewed.   Medicare Attestation I have personally reviewed: The patient's medical and social history Their use of alcohol, tobacco or illicit drugs Their current medications and supplements The patient's functional ability including ADLs,fall risks, home safety risks, cognitive, and hearing and visual impairment Diet and physical activities Evidence for depression or mood disorders  The patient's weight, height, and BMI have been recorded in the chart.  I have made referrals, counseling, and provided education to the patient based on review of the above and I have provided the patient with a written personalized care plan for preventive services.     Jill Alexanders, MD   08/14/2020

## 2020-08-15 LAB — COMPREHENSIVE METABOLIC PANEL
ALT: 20 IU/L (ref 0–44)
AST: 21 IU/L (ref 0–40)
Albumin/Globulin Ratio: 2 (ref 1.2–2.2)
Albumin: 4.8 g/dL — ABNORMAL HIGH (ref 3.7–4.7)
Alkaline Phosphatase: 74 IU/L (ref 44–121)
BUN/Creatinine Ratio: 19 (ref 10–24)
BUN: 15 mg/dL (ref 8–27)
Bilirubin Total: 0.6 mg/dL (ref 0.0–1.2)
CO2: 26 mmol/L (ref 20–29)
Calcium: 10 mg/dL (ref 8.6–10.2)
Chloride: 96 mmol/L (ref 96–106)
Creatinine, Ser: 0.8 mg/dL (ref 0.76–1.27)
GFR calc Af Amer: 98 mL/min/{1.73_m2} (ref >59)
GFR calc non Af Amer: 84 mL/min/{1.73_m2} (ref >59)
Globulin, Total: 2.4 g/dL (ref 1.5–4.5)
Glucose: 141 mg/dL — ABNORMAL HIGH (ref 65–99)
Potassium: 4.6 mmol/L (ref 3.5–5.2)
Sodium: 139 mmol/L (ref 134–144)
Total Protein: 7.2 g/dL (ref 6.0–8.5)

## 2020-08-15 LAB — CBC WITH DIFFERENTIAL/PLATELET
Basophils Absolute: 0.1 10*3/uL (ref 0.0–0.2)
Basos: 1 %
EOS (ABSOLUTE): 0.4 10*3/uL (ref 0.0–0.4)
Eos: 4 %
Hematocrit: 44.6 % (ref 37.5–51.0)
Hemoglobin: 14.8 g/dL (ref 13.0–17.7)
Immature Grans (Abs): 0 10*3/uL (ref 0.0–0.1)
Immature Granulocytes: 0 %
Lymphocytes Absolute: 1.8 10*3/uL (ref 0.7–3.1)
Lymphs: 20 %
MCH: 32.8 pg (ref 26.6–33.0)
MCHC: 33.2 g/dL (ref 31.5–35.7)
MCV: 99 fL — ABNORMAL HIGH (ref 79–97)
Monocytes Absolute: 0.8 10*3/uL (ref 0.1–0.9)
Monocytes: 9 %
Neutrophils Absolute: 5.6 10*3/uL (ref 1.4–7.0)
Neutrophils: 66 %
Platelets: 227 10*3/uL (ref 150–450)
RBC: 4.51 x10E6/uL (ref 4.14–5.80)
RDW: 11.7 % (ref 11.6–15.4)
WBC: 8.7 10*3/uL (ref 3.4–10.8)

## 2020-08-15 LAB — PSA: Prostate Specific Ag, Serum: 6.2 ng/mL — ABNORMAL HIGH (ref 0.0–4.0)

## 2020-09-23 DIAGNOSIS — Z961 Presence of intraocular lens: Secondary | ICD-10-CM | POA: Diagnosis not present

## 2020-09-23 DIAGNOSIS — H04123 Dry eye syndrome of bilateral lacrimal glands: Secondary | ICD-10-CM | POA: Diagnosis not present

## 2020-09-23 DIAGNOSIS — E119 Type 2 diabetes mellitus without complications: Secondary | ICD-10-CM | POA: Diagnosis not present

## 2020-09-23 DIAGNOSIS — H43812 Vitreous degeneration, left eye: Secondary | ICD-10-CM | POA: Diagnosis not present

## 2020-09-23 DIAGNOSIS — H02132 Senile ectropion of right lower eyelid: Secondary | ICD-10-CM | POA: Diagnosis not present

## 2020-09-23 LAB — HM DIABETES EYE EXAM

## 2020-10-02 ENCOUNTER — Encounter: Payer: Self-pay | Admitting: Family Medicine

## 2020-10-08 DIAGNOSIS — R69 Illness, unspecified: Secondary | ICD-10-CM | POA: Diagnosis not present

## 2020-10-13 ENCOUNTER — Other Ambulatory Visit: Payer: Self-pay | Admitting: Family Medicine

## 2020-10-13 DIAGNOSIS — I152 Hypertension secondary to endocrine disorders: Secondary | ICD-10-CM

## 2020-10-13 DIAGNOSIS — E1159 Type 2 diabetes mellitus with other circulatory complications: Secondary | ICD-10-CM

## 2020-12-16 ENCOUNTER — Other Ambulatory Visit (HOSPITAL_COMMUNITY): Payer: Self-pay | Admitting: Family Medicine

## 2020-12-16 ENCOUNTER — Other Ambulatory Visit: Payer: Self-pay

## 2020-12-16 ENCOUNTER — Ambulatory Visit (INDEPENDENT_AMBULATORY_CARE_PROVIDER_SITE_OTHER): Payer: Medicare HMO | Admitting: Family Medicine

## 2020-12-16 ENCOUNTER — Encounter: Payer: Self-pay | Admitting: Family Medicine

## 2020-12-16 VITALS — BP 160/78 | HR 65 | Temp 97.9°F | Wt 160.4 lb

## 2020-12-16 DIAGNOSIS — E1169 Type 2 diabetes mellitus with other specified complication: Secondary | ICD-10-CM | POA: Diagnosis not present

## 2020-12-16 DIAGNOSIS — E1121 Type 2 diabetes mellitus with diabetic nephropathy: Secondary | ICD-10-CM | POA: Diagnosis not present

## 2020-12-16 DIAGNOSIS — N401 Enlarged prostate with lower urinary tract symptoms: Secondary | ICD-10-CM | POA: Diagnosis not present

## 2020-12-16 DIAGNOSIS — N528 Other male erectile dysfunction: Secondary | ICD-10-CM

## 2020-12-16 DIAGNOSIS — E118 Type 2 diabetes mellitus with unspecified complications: Secondary | ICD-10-CM | POA: Diagnosis not present

## 2020-12-16 DIAGNOSIS — B070 Plantar wart: Secondary | ICD-10-CM

## 2020-12-16 DIAGNOSIS — E1159 Type 2 diabetes mellitus with other circulatory complications: Secondary | ICD-10-CM

## 2020-12-16 DIAGNOSIS — I152 Hypertension secondary to endocrine disorders: Secondary | ICD-10-CM

## 2020-12-16 DIAGNOSIS — I739 Peripheral vascular disease, unspecified: Secondary | ICD-10-CM

## 2020-12-16 DIAGNOSIS — E785 Hyperlipidemia, unspecified: Secondary | ICD-10-CM

## 2020-12-16 LAB — POCT GLYCOSYLATED HEMOGLOBIN (HGB A1C): Hemoglobin A1C: 6.9 % — AB (ref 4.0–5.6)

## 2020-12-16 MED ORDER — TADALAFIL 20 MG PO TABS: 20.0000 mg | ORAL_TABLET | ORAL | 1 refills | Status: DC

## 2020-12-16 NOTE — Progress Notes (Signed)
  Subjective:    Patient ID: Maxwell Sanders, male    DOB: July 19, 1941, 80 y.o.   MRN: [id number]  Maxwell Sanders is a 80 y.o. male who presents for follow-up of Type 2 diabetes mellitus.  Home blood sugar records: meter records, fasting, 99 -140 Current symptoms/problems include at this time. Daily foot checks:yes   Any foot concerns: left foot burning on side Exercise: active Diet: good  He is using Cialis for BPH symptoms stating it helps with his urination.  He continues on Metformin and having no difficulty with that.  He is also taking lisinopril/HCTZ and metoprolol without difficulty.  Simvastatin is causing no aches or pains.  He does complain of pain on the lateral aspect of the left foot near the fifth metatarsal plantar surface. The following portions of the patient's history were reviewed and updated as appropriate: allergies, current medications, past medical history, past social history and problem list.  ROS as in subjective above.     Objective:    Physical Exam Alert and in no distress exam of the left foot does show a 0.5 cm tender to palpation lesion over the plantar surface of MTP.  Lab Review Diabetic Labs Latest Ref Rng & Units 08/14/2020 04/23/2020 12/18/2019 08/14/2019 04/17/2019  HbA1c 4.0 - 5.6 % 6.7(A) 7.2(A) 7.2(A) 7.6(A) 7.2(A)  Microalbumin mg/L - - - 22.6 -  Micro/Creat Ratio - - - - 46.2 -  Chol 100 - 199 mg/dL - - - 143 -  HDL >39 mg/dL - - - 55 -  Calc LDL 0 - 99 mg/dL - - - 74 -  Triglycerides 0 - 149 mg/dL - - - 71 -  Creatinine 0.76 - 1.27 mg/dL 0.80 - - 0.78 -   BP/Weight 08/14/2020 04/23/2020 12/18/2019 08/14/2019 7/78/2423  Systolic BP 536 144 315 400 867  Diastolic BP 74 80 72 82 84  Wt. (Lbs) 160.8 162.4 169.8 175.8 175  BMI 25.18 24.69 25.82 26.73 26.61   Foot/eye exam completion dates Latest Ref Rng & Units 09/23/2020 08/14/2020  Eye Exam No Retinopathy No Retinopathy -  Foot Form Completion - - Done  Hemoglobin A1c is 6.9  Maxwell Sanders  reports that  he has quit smoking. He has never used smokeless tobacco. He reports that he does not drink alcohol and does not use drugs.     Assessment & Plan:    Type 2 diabetes with complication (HCC) - Plan: VAS [place] ABI WITH/WO TBI  Other male erectile dysfunction - Plan: tadalafil (CIALIS) 20 MG tablet  Benign prostatic hyperplasia with lower urinary tract symptoms, symptom details unspecified - Plan: tadalafil (CIALIS) 20 MG tablet  Hypertension associated with diabetes ([place])  Hyperlipidemia associated with type 2 diabetes mellitus ([place])  Diabetic nephropathy associated with type 2 diabetes mellitus ([place])  Plantar wart of right foot He will continue on his present medication regimen. Use Compound W on that for several days in a row and then scrape it off.  Do that on a weekly basis until it is gone  1. Rx changes: none 2. Education: Reviewed 'ABCs' of diabetes management (respective goals in parentheses):  A1C (<7), blood pressure (<130/80), and cholesterol (LDL <100). 3. Compliance at present is estimated to be good. Efforts to improve compliance (if necessary) will be directed at increased exercise. 4. Follow up: 4 months

## 2020-12-16 NOTE — Patient Instructions (Signed)
Use Compound W on that for several days in a row and then scrape it off.  Do that on a weekly basis until it is gone

## 2020-12-16 NOTE — Addendum Note (Signed)
Addended by: Elyse Jarvis on: 12/16/2020 02:13 PM   Modules accepted: Orders

## 2020-12-23 ENCOUNTER — Ambulatory Visit (HOSPITAL_COMMUNITY)
Admission: RE | Admit: 2020-12-23 | Discharge: 2020-12-23 | Disposition: A | Discharge status: Home / Self Care | Payer: Medicare HMO | Source: Ambulatory Visit | Attending: Cardiovascular Disease | Admitting: Cardiovascular Disease

## 2020-12-23 ENCOUNTER — Other Ambulatory Visit: Payer: Self-pay

## 2020-12-23 DIAGNOSIS — E118 Type 2 diabetes mellitus with unspecified complications: Secondary | ICD-10-CM | POA: Diagnosis not present

## 2020-12-23 DIAGNOSIS — I739 Peripheral vascular disease, unspecified: Secondary | ICD-10-CM | POA: Diagnosis not present

## 2021-01-06 ENCOUNTER — Encounter: Payer: Self-pay | Admitting: Cardiovascular Disease

## 2021-01-06 ENCOUNTER — Other Ambulatory Visit: Payer: Self-pay

## 2021-01-06 ENCOUNTER — Ambulatory Visit: Payer: Medicare HMO | Admitting: Cardiovascular Disease

## 2021-01-06 VITALS — BP 150/60 | HR 65 | Ht 68.0 in | Wt 164.0 lb

## 2021-01-06 DIAGNOSIS — I10 Essential (primary) hypertension: Secondary | ICD-10-CM

## 2021-01-06 DIAGNOSIS — E785 Hyperlipidemia, unspecified: Secondary | ICD-10-CM | POA: Diagnosis not present

## 2021-01-06 DIAGNOSIS — I739 Peripheral vascular disease, unspecified: Secondary | ICD-10-CM

## 2021-01-06 NOTE — Progress Notes (Signed)
Cardiology Office Note   Date:  01/08/2021   ID:  Maxwell Sanders, DOB 1941-06-30, MRN [id number]  PCP:  Denita Lung, MD  Cardiologist:   Kathlyn Sacramento, MD   No chief complaint on file.     History of Present Illness: Maxwell Sanders is a 80 y.o. male who was referred by Dr. Redmond School for evaluation management of peripheral arterial disease.  He has known history of type 2 diabetes with peripheral neuropathy, hyperlipidemia, essential hypertension, previous remote tobacco use, BPH, erectile dysfunction and peptic ulcer disease. The patient recently complained of left foot discomfort on the lateral side.  He had a callus there that subsequently improved and the discomfort is not as bad.  In addition, he does report left calf discomfort with overexertion that does not happen with regular activities.  He has no lower extremity ulceration.  He underwent recent noninvasive vascular evaluation which showed noncompressible vessels on the right side and an ABI of 0.68 on the left.  Duplex showed diffuse nonobstructive disease on the right side.  On the left side, the popliteal artery was occluded into the TP trunk with collaterals.  He denies chest pain or shortness of breath.  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 Wynetta Emery   SKIN BIOPSY Right 02/08/2020   well differentiated squamous cell carcinoma     Current Outpatient Medications  Medication Sig Dispense Refill   aspirin 81 MG chewable tablet Chew by mouth daily.     Cholecalciferol (VITAMIN D3) 3000 units TABS Take by  mouth.     Glucosamine-Chondroit-Vit C-Mn (GLUCOSAMINE 1500 COMPLEX PO) Take by mouth.     glucose blood test strip 1 each by Other route as needed. Use as instructed 100 each 3   lisinopril-hydrochlorothiazide (ZESTORETIC) 20-12.5 MG tablet Take 1 tablet by mouth once daily 90 tablet 0   metFORMIN (GLUCOPHAGE) 500 MG tablet TAKE 1 TABLET BY MOUTH TWICE DAILY WITH A MEAL 180 tablet 1   metoprolol tartrate (LOPRESSOR) 100 MG tablet Take 1 tablet (100 mg total) by mouth 2 (two) times daily. 180 tablet 3   MICROLET LANCETS MISC Daily 100 each PRN   Multiple Vitamins-Minerals (MULTIVITAMIN WITH MINERALS) tablet Take 1 tablet by mouth daily.     Omega-3 Fatty Acids (FISH OIL OMEGA-3 PO) Take by mouth.     OneTouch Delica Lancets 14H MISC 1 each by Does not apply route daily. 100 each 3   simvastatin (ZOCOR) 20 MG tablet Take 1 tablet (20 mg total) by mouth daily. 90 tablet 3   tadalafil (CIALIS) 20 MG tablet Take 1 tablet (20 mg total) by mouth 2 (two) times a week. 90 tablet 1   No current facility-administered medications for this visit.    Allergies:   Penicillins    Social History:  The patient  reports that he has quit smoking. He has never used smokeless tobacco. He reports that he does not drink alcohol and does not use drugs.   Family History:  The patient's family history is negative for coronary artery disease.   ROS:  Please see the history of present illness.   Otherwise, review of systems are positive for none.   All other systems are reviewed and negative.    PHYSICAL EXAM: VS:  BP (!) 150/60    Pulse 65    Ht 5\' 8"  (1.727 m)    Wt 164 lb (74.4 kg)    SpO2 100%    BMI 24.94 kg/m  , BMI Body mass index is 24.94 kg/m. GEN: Well nourished, well developed, in no acute distress  HEENT: normal  Neck: no JVD, carotid bruits, or masses Cardiac: RRR; no murmurs, rubs, or gallops,no edema  Respiratory:  clear to auscultation bilaterally, normal work of breathing GI:  soft, nontender, nondistended, + BS MS: no deformity or atrophy  Skin: warm and dry, no rash Neuro:  Strength and sensation are intact Psych: euthymic mood, full affect Vascular: Pedal pulses are palpable on the right side but not the left side.  No lower extremity ulceration.   EKG:  EKG is ordered today. 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   Recent Labs: 08/14/2020: ALT 20; BUN 15; Creatinine, Ser 0.80; Hemoglobin 14.8; Platelets 227; Potassium 4.6; Sodium 139    Lipid Panel    Component Value Date/Time   CHOL 143 08/14/2019 1126   TRIG 71 08/14/2019 1126   HDL 55 08/14/2019 1126   CHOLHDL 2.6 08/14/2019 1126   CHOLHDL 2.6 07/27/2017 1003   VLDL 15 07/22/2016 1013   LDLCALC 74 08/14/2019 1126   LDLCALC 70 07/27/2017 1003      Wt Readings from Last 3 Encounters:  01/06/21 164 lb (74.4 kg)  12/16/20 160 lb 6.4 oz (72.8 kg)  08/14/20 160 lb 12.8 oz (72.9 kg)       No flowsheet data found.    ASSESSMENT AND PLAN:  1.  Peripheral arterial disease: The patient has evidence of occluded left popliteal artery into the TP trunk.  His symptoms include mild left calf claudication.  The left foot lateral pain does not seem to be due to peripheral arterial disease and more likely to be due to callus.  In addition, he does have some symptoms of peripheral neuropathy. There is no evidence of ulceration and I do not see evidence of critical limb ischemia. Given that his symptoms are mild overall and not lifestyle limiting, I recommend continuing medical therapy.  We can reserve angiography and revascularization for worsening symptoms.  2.  Essential hypertension: Blood pressure is mildly elevated but usually well controlled.  Continue to monitor.  3.  Hyperlipidemia: Currently on simvastatin 20 mg daily.  Most recent lipid profile showed an LDL of 74.    Disposition:   FU with me in 1 year or earlier if needed.  Signed,  Kathlyn Sacramento, MD  01/08/2021 10:19 AM    [place]

## 2021-01-06 NOTE — Patient Instructions (Signed)

## 2021-01-13 ENCOUNTER — Other Ambulatory Visit: Payer: Self-pay | Admitting: Family Medicine

## 2021-01-13 DIAGNOSIS — I152 Hypertension secondary to endocrine disorders: Secondary | ICD-10-CM

## 2021-01-13 DIAGNOSIS — E1159 Type 2 diabetes mellitus with other circulatory complications: Secondary | ICD-10-CM

## 2021-02-10 DIAGNOSIS — L438 Other lichen planus: Secondary | ICD-10-CM | POA: Diagnosis not present

## 2021-02-10 DIAGNOSIS — L812 Freckles: Secondary | ICD-10-CM | POA: Diagnosis not present

## 2021-02-10 DIAGNOSIS — Z85828 Personal history of other malignant neoplasm of skin: Secondary | ICD-10-CM | POA: Diagnosis not present

## 2021-02-10 DIAGNOSIS — L570 Actinic keratosis: Secondary | ICD-10-CM | POA: Diagnosis not present

## 2021-02-10 DIAGNOSIS — D485 Neoplasm of uncertain behavior of skin: Secondary | ICD-10-CM | POA: Diagnosis not present

## 2021-02-10 DIAGNOSIS — L821 Other seborrheic keratosis: Secondary | ICD-10-CM | POA: Diagnosis not present

## 2021-02-10 DIAGNOSIS — L853 Xerosis cutis: Secondary | ICD-10-CM | POA: Diagnosis not present

## 2021-02-10 DIAGNOSIS — C434 Malignant melanoma of scalp and neck: Secondary | ICD-10-CM | POA: Diagnosis not present

## 2021-02-19 ENCOUNTER — Encounter: Payer: Self-pay | Admitting: Family Medicine

## 2021-02-19 DIAGNOSIS — C439 Malignant melanoma of skin, unspecified: Secondary | ICD-10-CM | POA: Insufficient documentation

## 2021-02-24 DIAGNOSIS — D485 Neoplasm of uncertain behavior of skin: Secondary | ICD-10-CM | POA: Diagnosis not present

## 2021-02-24 DIAGNOSIS — C434 Malignant melanoma of scalp and neck: Secondary | ICD-10-CM | POA: Diagnosis not present

## 2021-02-26 DIAGNOSIS — C434 Malignant melanoma of scalp and neck: Secondary | ICD-10-CM | POA: Diagnosis not present

## 2021-03-12 ENCOUNTER — Other Ambulatory Visit: Payer: Self-pay | Admitting: Family Medicine

## 2021-03-12 DIAGNOSIS — E118 Type 2 diabetes mellitus with unspecified complications: Secondary | ICD-10-CM

## 2021-03-13 DIAGNOSIS — E7800 Pure hypercholesterolemia, unspecified: Secondary | ICD-10-CM | POA: Diagnosis not present

## 2021-03-13 DIAGNOSIS — E119 Type 2 diabetes mellitus without complications: Secondary | ICD-10-CM | POA: Diagnosis not present

## 2021-03-13 DIAGNOSIS — I10 Essential (primary) hypertension: Secondary | ICD-10-CM | POA: Diagnosis not present

## 2021-03-13 DIAGNOSIS — C434 Malignant melanoma of scalp and neck: Secondary | ICD-10-CM | POA: Insufficient documentation

## 2021-03-27 DIAGNOSIS — C434 Malignant melanoma of scalp and neck: Secondary | ICD-10-CM | POA: Diagnosis not present

## 2021-03-27 DIAGNOSIS — Z09 Encounter for follow-up examination after completed treatment for conditions other than malignant neoplasm: Secondary | ICD-10-CM | POA: Diagnosis not present

## 2021-04-13 DIAGNOSIS — Z09 Encounter for follow-up examination after completed treatment for conditions other than malignant neoplasm: Secondary | ICD-10-CM | POA: Diagnosis not present

## 2021-04-13 DIAGNOSIS — C434 Malignant melanoma of scalp and neck: Secondary | ICD-10-CM | POA: Diagnosis not present

## 2021-04-14 ENCOUNTER — Other Ambulatory Visit: Payer: Self-pay | Admitting: Family Medicine

## 2021-04-14 DIAGNOSIS — C439 Malignant melanoma of skin, unspecified: Secondary | ICD-10-CM | POA: Insufficient documentation

## 2021-04-14 DIAGNOSIS — I152 Hypertension secondary to endocrine disorders: Secondary | ICD-10-CM

## 2021-04-21 ENCOUNTER — Ambulatory Visit (INDEPENDENT_AMBULATORY_CARE_PROVIDER_SITE_OTHER): Payer: Medicare HMO | Admitting: Family Medicine

## 2021-04-21 ENCOUNTER — Encounter: Payer: Self-pay | Admitting: Family Medicine

## 2021-04-21 ENCOUNTER — Other Ambulatory Visit: Payer: Self-pay

## 2021-04-21 VITALS — BP 136/66 | HR 62 | Temp 98.2°F | Ht 67.5 in | Wt 162.2 lb

## 2021-04-21 DIAGNOSIS — I739 Peripheral vascular disease, unspecified: Secondary | ICD-10-CM | POA: Insufficient documentation

## 2021-04-21 DIAGNOSIS — C434 Malignant melanoma of scalp and neck: Secondary | ICD-10-CM

## 2021-04-21 DIAGNOSIS — E118 Type 2 diabetes mellitus with unspecified complications: Secondary | ICD-10-CM

## 2021-04-21 DIAGNOSIS — E1169 Type 2 diabetes mellitus with other specified complication: Secondary | ICD-10-CM | POA: Diagnosis not present

## 2021-04-21 DIAGNOSIS — I152 Hypertension secondary to endocrine disorders: Secondary | ICD-10-CM | POA: Diagnosis not present

## 2021-04-21 DIAGNOSIS — J302 Other seasonal allergic rhinitis: Secondary | ICD-10-CM

## 2021-04-21 DIAGNOSIS — E785 Hyperlipidemia, unspecified: Secondary | ICD-10-CM | POA: Diagnosis not present

## 2021-04-21 DIAGNOSIS — E1121 Type 2 diabetes mellitus with diabetic nephropathy: Secondary | ICD-10-CM

## 2021-04-21 DIAGNOSIS — E1159 Type 2 diabetes mellitus with other circulatory complications: Secondary | ICD-10-CM

## 2021-04-21 DIAGNOSIS — Z23 Encounter for immunization: Secondary | ICD-10-CM | POA: Diagnosis not present

## 2021-04-21 LAB — POCT GLYCOSYLATED HEMOGLOBIN (HGB A1C): Hemoglobin A1C: 6.6 % — AB (ref 4.0–5.6)

## 2021-04-21 LAB — POCT UA - MICROALBUMIN
Albumin/Creatinine Ratio, Urine, POC: 35.4
Creatinine, POC: 40.4 mg/dL
Microalbumin Ur, POC: 14.3 mg/L

## 2021-04-21 NOTE — Progress Notes (Signed)
  Subjective:    Patient ID: Maxwell Sanders, male    DOB: 1941-04-30, 80 y.o.   MRN: [id number]  Maxwell Sanders is a 80 y.o. male who presents for follow-up of Type 2 diabetes mellitus.  Home blood sugar records: meter record,fasting, 103-160 Current symptoms/problems include none at this time. Daily foot checks:yes   Any foot concerns: none at this time  Exercise: staying active Diet:Good He also has a history of malignant melanoma and is being followed at [facility] for this.  He also has history of PVD and was seen recently by cardiovascular.  Apparently they are taking a watchful waiting approach as it did show revascularization.  He continues on metformin and simvastatin as well as lisinopril/HCTZ.  He is having no difficulty with them.  He does intermittently use Cialis.  His allergies seem to be under good control.  Review of the record indicates he does have evidence of diabetic nephropathy. The following portions of the patient's history were reviewed and updated as appropriate: allergies, current medications, past medical history, past social history and problem list.  ROS as in subjective above.     Objective:    Physical Exam Alert and in no distress otherwise not examined. Hemoglobin A1c is 6.6 Blood pressure 136/66, pulse 62, temperature 98.2 F (36.8 C), height 5' 7.5" (1.715 m), weight 162 lb 3.2 oz (73.6 kg), SpO2 97 %.  Lab Review Diabetic Labs Latest Ref Rng & Units 12/16/2020 08/14/2020 04/23/2020 12/18/2019 08/14/2019  HbA1c 4.0 - 5.6 % 6.9(A) 6.7(A) 7.2(A) 7.2(A) 7.6(A)  Microalbumin mg/L - - - - 22.6  Micro/Creat Ratio - - - - - 46.2  Chol 100 - 199 mg/dL - - - - 143  HDL >39 mg/dL - - - - 55  Calc LDL 0 - 99 mg/dL - - - - 74  Triglycerides 0 - 149 mg/dL - - - - 71  Creatinine 0.76 - 1.27 mg/dL - 0.80 - - 0.78   BP/Weight 04/21/2021 01/06/2021 12/16/2020 1/74/9449 04/29/5915  Systolic BP 384 665 993 570 177  Diastolic BP 66 60 78 74 80  Wt. (Lbs) 162.2 164 160.4  160.8 162.4  BMI 25.03 24.94 25.12 25.18 24.69   Foot/eye exam completion dates Latest Ref Rng & Units 09/23/2020 08/14/2020  Eye Exam No Retinopathy No Retinopathy -  Foot Form Completion - - Done    Alfio  reports that he has quit smoking. He has never used smokeless tobacco. He reports that he does not drink alcohol and does not use drugs.     Assessment & Plan:    Type 2 diabetes with complication (HCC) - Plan: POCT UA - Microalbumin, POCT glycosylated hemoglobin (Hb A1C)  Hypertension associated with diabetes ([place])  Hyperlipidemia associated with type 2 diabetes mellitus ([place])  Diabetic nephropathy associated with type 2 diabetes mellitus (HCC)  Seasonal allergic rhinitis, unspecified trigger  Melanoma of scalp (HCC)  Immunization, viral disease - Plan: PFIZER Comirnaty(GRAY TOP)COVID-19 Vaccine  Type 2 diabetes mellitus with other specified complication, without long-term current use of insulin (HCC) 1.  2. Rx changes: none 3. Education: Reviewed 'ABCs' of diabetes management (respective goals in parentheses):  A1C (<7), blood pressure (<130/80), and cholesterol (LDL <100). 4. Compliance at present is estimated to be good. Efforts to improve compliance (if necessary) will be directed at No change. 5. Follow up: 4 months

## 2021-05-05 DIAGNOSIS — C44729 Squamous cell carcinoma of skin of left lower limb, including hip: Secondary | ICD-10-CM | POA: Diagnosis not present

## 2021-05-05 DIAGNOSIS — D485 Neoplasm of uncertain behavior of skin: Secondary | ICD-10-CM | POA: Diagnosis not present

## 2021-05-05 DIAGNOSIS — Z85820 Personal history of malignant melanoma of skin: Secondary | ICD-10-CM | POA: Diagnosis not present

## 2021-05-05 HISTORY — PX: OTHER SURGICAL HISTORY: SHX169

## 2021-05-14 DIAGNOSIS — D0472 Carcinoma in situ of skin of left lower limb, including hip: Secondary | ICD-10-CM

## 2021-05-14 HISTORY — DX: Carcinoma in situ of skin of left lower limb, including hip: D04.72

## 2021-06-13 ENCOUNTER — Other Ambulatory Visit: Payer: Self-pay | Admitting: Family Medicine

## 2021-06-13 DIAGNOSIS — E1159 Type 2 diabetes mellitus with other circulatory complications: Secondary | ICD-10-CM

## 2021-06-13 DIAGNOSIS — I152 Hypertension secondary to endocrine disorders: Secondary | ICD-10-CM

## 2021-06-17 ENCOUNTER — Other Ambulatory Visit: Payer: Self-pay | Admitting: Family Medicine

## 2021-06-17 DIAGNOSIS — E118 Type 2 diabetes mellitus with unspecified complications: Secondary | ICD-10-CM

## 2021-07-01 ENCOUNTER — Other Ambulatory Visit: Payer: Self-pay | Admitting: Family Medicine

## 2021-07-01 DIAGNOSIS — E118 Type 2 diabetes mellitus with unspecified complications: Secondary | ICD-10-CM

## 2021-08-17 ENCOUNTER — Other Ambulatory Visit: Payer: Self-pay | Admitting: Family Medicine

## 2021-08-17 DIAGNOSIS — E1159 Type 2 diabetes mellitus with other circulatory complications: Secondary | ICD-10-CM

## 2021-08-17 DIAGNOSIS — I152 Hypertension secondary to endocrine disorders: Secondary | ICD-10-CM

## 2021-08-18 DIAGNOSIS — L570 Actinic keratosis: Secondary | ICD-10-CM | POA: Diagnosis not present

## 2021-08-18 DIAGNOSIS — L821 Other seborrheic keratosis: Secondary | ICD-10-CM | POA: Diagnosis not present

## 2021-08-18 DIAGNOSIS — Z85828 Personal history of other malignant neoplasm of skin: Secondary | ICD-10-CM | POA: Diagnosis not present

## 2021-08-18 DIAGNOSIS — C44629 Squamous cell carcinoma of skin of left upper limb, including shoulder: Secondary | ICD-10-CM | POA: Diagnosis not present

## 2021-08-18 DIAGNOSIS — Z85820 Personal history of malignant melanoma of skin: Secondary | ICD-10-CM | POA: Diagnosis not present

## 2021-08-18 DIAGNOSIS — L812 Freckles: Secondary | ICD-10-CM | POA: Diagnosis not present

## 2021-08-18 DIAGNOSIS — L720 Epidermal cyst: Secondary | ICD-10-CM | POA: Diagnosis not present

## 2021-08-18 DIAGNOSIS — D485 Neoplasm of uncertain behavior of skin: Secondary | ICD-10-CM | POA: Diagnosis not present

## 2021-09-12 ENCOUNTER — Other Ambulatory Visit: Payer: Self-pay | Admitting: Family Medicine

## 2021-09-12 DIAGNOSIS — I152 Hypertension secondary to endocrine disorders: Secondary | ICD-10-CM

## 2021-09-15 ENCOUNTER — Encounter: Payer: Self-pay | Admitting: Family Medicine

## 2021-09-15 ENCOUNTER — Ambulatory Visit (INDEPENDENT_AMBULATORY_CARE_PROVIDER_SITE_OTHER): Payer: Medicare HMO | Admitting: Family Medicine

## 2021-09-15 ENCOUNTER — Other Ambulatory Visit: Payer: Self-pay

## 2021-09-15 VITALS — BP 130/82 | HR 61 | Temp 97.8°F | Ht 66.0 in | Wt 165.8 lb

## 2021-09-15 DIAGNOSIS — E1159 Type 2 diabetes mellitus with other circulatory complications: Secondary | ICD-10-CM

## 2021-09-15 DIAGNOSIS — I152 Hypertension secondary to endocrine disorders: Secondary | ICD-10-CM

## 2021-09-15 DIAGNOSIS — N529 Male erectile dysfunction, unspecified: Secondary | ICD-10-CM

## 2021-09-15 DIAGNOSIS — J302 Other seasonal allergic rhinitis: Secondary | ICD-10-CM

## 2021-09-15 DIAGNOSIS — C434 Malignant melanoma of scalp and neck: Secondary | ICD-10-CM

## 2021-09-15 DIAGNOSIS — Z0000 Encounter for general adult medical examination without abnormal findings: Secondary | ICD-10-CM

## 2021-09-15 DIAGNOSIS — N401 Enlarged prostate with lower urinary tract symptoms: Secondary | ICD-10-CM

## 2021-09-15 DIAGNOSIS — D0472 Carcinoma in situ of skin of left lower limb, including hip: Secondary | ICD-10-CM

## 2021-09-15 DIAGNOSIS — E118 Type 2 diabetes mellitus with unspecified complications: Secondary | ICD-10-CM

## 2021-09-15 DIAGNOSIS — E1121 Type 2 diabetes mellitus with diabetic nephropathy: Secondary | ICD-10-CM | POA: Diagnosis not present

## 2021-09-15 DIAGNOSIS — E785 Hyperlipidemia, unspecified: Secondary | ICD-10-CM | POA: Diagnosis not present

## 2021-09-15 DIAGNOSIS — E1169 Type 2 diabetes mellitus with other specified complication: Secondary | ICD-10-CM

## 2021-09-15 DIAGNOSIS — I739 Peripheral vascular disease, unspecified: Secondary | ICD-10-CM

## 2021-09-15 DIAGNOSIS — Z23 Encounter for immunization: Secondary | ICD-10-CM | POA: Diagnosis not present

## 2021-09-15 DIAGNOSIS — C4440 Unspecified malignant neoplasm of skin of scalp and neck: Secondary | ICD-10-CM

## 2021-09-15 LAB — POCT GLYCOSYLATED HEMOGLOBIN (HGB A1C): Hemoglobin A1C: 6.6 % — AB (ref 4.0–5.6)

## 2021-09-15 MED ORDER — LISINOPRIL-HYDROCHLOROTHIAZIDE 20-12.5 MG PO TABS: 1.0000 | ORAL_TABLET | Freq: Every day | ORAL | 3 refills | Status: DC

## 2021-09-15 MED ORDER — SIMVASTATIN 20 MG PO TABS: 20.0000 mg | ORAL_TABLET | Freq: Every day | ORAL | 3 refills | Status: DC

## 2021-09-15 MED ORDER — METOPROLOL TARTRATE 100 MG PO TABS: 100.0000 mg | ORAL_TABLET | Freq: Two times a day (BID) | ORAL | 3 refills | Status: DC

## 2021-09-15 MED ORDER — METFORMIN HCL 500 MG PO TABS: 500.0000 mg | ORAL_TABLET | Freq: Two times a day (BID) | ORAL | 1 refills | Status: DC

## 2021-09-15 NOTE — Progress Notes (Signed)
Maxwell Sanders is a 80 y.o. male who presents for annual wellness visit,CPE and follow-up on chronic medical conditions.  He has type 2 diabetes and is using metformin.  He continues on metoprolol, lisinopril/HCTZ.  He is having no difficulty with simvastatin.  Does use Cialis on an as-needed basis.  He is followed by dermatology in [facility] because of malignant melanoma.  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  He does have a history of PAD and at this time no intervention is entertained.  Exercise was the main recommendation.  His allergies seem to be under good control.   Immunizations and Health Maintenance Immunization History  Administered Date(s) Administered   DT (Pediatric) 11/07/2002   Fluad Quad(high Dose 65+) 08/14/2019, 08/14/2020   Influenza, High Dose Seasonal PF 12/02/2015, 07/22/2016, 07/27/2017, 12/12/2018   Influenza, Seasonal, Injecte, Preservative Fre 12/07/2012   PFIZER Comirnaty(Gray Top)Covid-19 Tri-Sucrose Vaccine 04/21/2021   PFIZER(Purple Top)SARS-COV-2 Vaccination 12/24/2019, 01/14/2020, 08/14/2020   Pneumococcal Conjugate-13 06/21/2013   Pneumococcal Polysaccharide-23 05/11/2006   Tdap 04/21/2012   Zoster Recombinat (Shingrix) 04/10/2018, 06/17/2018   Zoster, Live 03/14/2008   Health Maintenance Due  Topic Date Due   COVID-19 Vaccine (5 - Booster for Pfizer series) 06/16/2021   INFLUENZA VACCINE  06/22/2021    Last colonoscopy:08/05/2015 cologuard: 08/09/18  Last PSA:08/14/20 level 6.2 Dentist: Q six months  Ophtho: Q year Exercise: work out at home at least 20 min a day  Other doctors caring for patient include:Dr. Ronnald Ramp dermatology, Dr. Fletcher Anon cardiology, Dr. Katy [facility]  Advanced Directives: Does Patient Have a Medical Advance Directive?: Yes Type of Advance Directive: Living will, Healthcare Power of [place] in Chart?: No - copy requested  Depression screen:  See questionnaire below.      Depression screen [facility] 2/9 09/15/2021 08/14/2020 08/14/2019 08/02/2018 07/27/2017  Decreased Interest 0 0 0 0 0  Down, Depressed, Hopeless 0 0 0 0 0  PHQ - 2 Score 0 0 0 0 0    Fall Screen: See Questionaire below.   Fall Risk  09/15/2021 08/14/2020 08/14/2019 08/02/2018 07/27/2017  Falls in the past year? 0 0 0 No No  Number falls in past yr: 0 - - - -  Injury with Fall? 0 - - - -  Risk for fall due to : History of fall(s) - - - -  Follow up Falls evaluation completed - - - -    ADL screen:  See questionnaire below.  Functional Status Survey: Is the patient deaf or have difficulty hearing?: No Does the patient have difficulty seeing, even when wearing glasses/contacts?: No Does the patient have difficulty concentrating, remembering, or making decisions?: No Does the patient have difficulty walking or climbing stairs?: No Does the patient have difficulty dressing or bathing?: No Does the patient have difficulty doing errands alone such as visiting a doctor's office or shopping?: No   Review of Systems  Constitutional: -, -unexpected weight change, -anorexia, -fatigue Allergy: -sneezing, -itching, -congestion Dermatology: denies changing moles, rash, lumps ENT: -runny nose, -ear pain, -sore throat,  Cardiology:  -chest pain, -palpitations, -orthopnea, Respiratory: -cough, -shortness of breath, -dyspnea on exertion, -wheezing,  Gastroenterology: -abdominal pain, -nausea, -vomiting, -diarrhea, -constipation, -dysphagia Hematology: -bleeding or bruising problems Musculoskeletal: -arthralgias, -myalgias, -joint swelling, -back pain, - Ophthalmology: -vision changes,  Urology: -dysuria, -difficulty urinating,  -urinary frequency, -urgency, incontinence Neurology: -, -numbness, , -memory loss, -falls, -dizziness  PHYSICAL EXAM:   General Appearance: Alert, cooperative,  no distress, appears stated age Head: Normocephalic, without obvious abnormality, atraumatic Eyes: PERRL,  conjunctiva/corneas clear, EOM's intact,  Ears: Normal TM's and external ear canals Nose: Nares normal, mucosa normal, no drainage or sinus   tenderness Throat: Lips, mucosa, and tongue normal; teeth and gums normal Neck: Supple, no lymphadenopathy, thyroid:no enlargement/tenderness/nodules; no carotid bruit or JVD Lungs: Clear to auscultation bilaterally without wheezes, rales or ronchi; respirations unlabored Heart: Regular rate and rhythm, S1 and S2 normal, no murmur, rub or gallop Abdomen: Soft, non-tender, nondistended, normoactive bowel sounds, no masses, no hepatosplenomegaly Extremities: No clubbing, cyanosis or edema.  Diabetic foot exam is normal. Pulses: 2+ and symmetric all extremities Skin: Exam of the scalp does show recent rather large scar as well as actinic lesions and discolorations.  Lymph nodes: Cervical, supraclavicular, and axillary nodes normal Neurologic: CNII-XII intact, normal strength, sensation and gait; reflexes 2+ and symmetric throughout   Psych: Normal mood, affect, hygiene and grooming Hemoglobin A1c is 6.6 ASSESSMENT/PLAN: Routine general medical examination at a health care facility  Need for influenza vaccination - Plan: Flu Vaccine QUAD High Dose(Fluad), Pfizer Covid-19 Vaccine Bivalent Booster  Need for COVID-19 vaccine  Type 2 diabetes mellitus with other specified complication, without long-term current use of insulin ([place]) - Plan: POCT glycosylated hemoglobin (Hb A1C), CBC with Differential/Platelet, Comprehensive metabolic panel, Lipid panel  Hypertension associated with diabetes ([place]) - Plan: CBC with Differential/Platelet, Comprehensive metabolic panel, metoprolol tartrate (LOPRESSOR) 100 MG tablet, lisinopril-hydrochlorothiazide (ZESTORETIC) 20-12.5 MG tablet  Hyperlipidemia associated with type 2 diabetes mellitus ([place]) - Plan: Lipid panel, simvastatin (ZOCOR) 20 MG tablet  Diabetic nephropathy associated with type 2 diabetes mellitus  (HCC)  Seasonal allergic rhinitis, unspecified trigger  Melanoma of scalp (HCC)  Type 2 diabetes mellitus with complication, without long-term current use of insulin ([place]) - Plan: metFORMIN (GLUCOPHAGE) 500 MG tablet  PVD (peripheral vascular disease) (HCC)  Benign prostatic hyperplasia with lower urinary tract symptoms, symptom details unspecified  Erectile dysfunction, unspecified erectile dysfunction type  Skin cancer of scalp  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   recommended at least 30inutes of aerobic activity at least 5 days/week; proper sunscreen use reviewed; [place] Immunization recommendations discussed.  Colonoscopy recommendations reviewed.   Medicare Attestation I have personally reviewed: The patient's medical and social history Their use of alcohol, tobacco or illicit drugs Their current medications and supplements The patient's functional ability including ADLs,fall risks, home safety risks, cognitive, and hearing and visual impairment Diet and physical activities Evidence for depression or mood disorders  The patient's weight, height, and BMI have been recorded in the chart.  I have made referrals, counseling, and provided education to the patient based on review of the above and I have provided the patient with a written personalized care plan for preventive services.     Jill Alexanders, MD   09/15/2021

## 2021-09-16 LAB — COMPREHENSIVE METABOLIC PANEL
ALT: 19 IU/L (ref 0–44)
AST: 23 IU/L (ref 0–40)
Albumin/Globulin Ratio: 1.8 (ref 1.2–2.2)
Albumin: 4.6 g/dL (ref 3.6–4.6)
Alkaline Phosphatase: 89 IU/L (ref 44–121)
BUN/Creatinine Ratio: 23 (ref 10–24)
BUN: 18 mg/dL (ref 8–27)
Bilirubin Total: 0.3 mg/dL (ref 0.0–1.2)
CO2: 25 mmol/L (ref 20–29)
Calcium: 9.4 mg/dL (ref 8.6–10.2)
Chloride: 100 mmol/L (ref 96–106)
Creatinine, Ser: 0.79 mg/dL (ref 0.76–1.27)
Globulin, Total: 2.5 g/dL (ref 1.5–4.5)
Glucose: 115 mg/dL — ABNORMAL HIGH (ref 70–99)
Potassium: 4.4 mmol/L (ref 3.5–5.2)
Sodium: 138 mmol/L (ref 134–144)
Total Protein: 7.1 g/dL (ref 6.0–8.5)
eGFR: 89 mL/min/{1.73_m2} (ref >59)

## 2021-09-16 LAB — CBC WITH DIFFERENTIAL/PLATELET
Basophils Absolute: 0.1 10*3/uL (ref 0.0–0.2)
Basos: 2 %
EOS (ABSOLUTE): 0.3 10*3/uL (ref 0.0–0.4)
Eos: 4 %
Hematocrit: 39.5 % (ref 37.5–51.0)
Hemoglobin: 13.2 g/dL (ref 13.0–17.7)
Immature Grans (Abs): 0 10*3/uL (ref 0.0–0.1)
Immature Granulocytes: 0 %
Lymphocytes Absolute: 1.8 10*3/uL (ref 0.7–3.1)
Lymphs: 24 %
MCH: 32.1 pg (ref 26.6–33.0)
MCHC: 33.4 g/dL (ref 31.5–35.7)
MCV: 96 fL (ref 79–97)
Monocytes Absolute: 1 10*3/uL — ABNORMAL HIGH (ref 0.1–0.9)
Monocytes: 13 %
Neutrophils Absolute: 4.5 10*3/uL (ref 1.4–7.0)
Neutrophils: 57 %
Platelets: 244 10*3/uL (ref 150–450)
RBC: 4.11 x10E6/uL — ABNORMAL LOW (ref 4.14–5.80)
RDW: 11.7 % (ref 11.6–15.4)
WBC: 7.7 10*3/uL (ref 3.4–10.8)

## 2021-09-16 LAB — LIPID PANEL
Chol/HDL Ratio: 2.7 ratio (ref 0.0–5.0)
Cholesterol, Total: 150 mg/dL (ref 100–199)
HDL: 56 mg/dL (ref >39)
LDL Chol Calc (NIH): 82 mg/dL (ref 0–99)
Triglycerides: 55 mg/dL (ref 0–149)
VLDL Cholesterol Cal: 12 mg/dL (ref 5–40)

## 2021-09-18 ENCOUNTER — Other Ambulatory Visit: Payer: Self-pay | Admitting: Family Medicine

## 2021-09-23 DIAGNOSIS — H02132 Senile ectropion of right lower eyelid: Secondary | ICD-10-CM | POA: Diagnosis not present

## 2021-09-23 DIAGNOSIS — E119 Type 2 diabetes mellitus without complications: Secondary | ICD-10-CM | POA: Diagnosis not present

## 2021-09-23 DIAGNOSIS — D3131 Benign neoplasm of right choroid: Secondary | ICD-10-CM | POA: Diagnosis not present

## 2021-09-23 DIAGNOSIS — Z961 Presence of intraocular lens: Secondary | ICD-10-CM | POA: Diagnosis not present

## 2021-09-23 DIAGNOSIS — H02135 Senile ectropion of left lower eyelid: Secondary | ICD-10-CM | POA: Diagnosis not present

## 2021-09-23 DIAGNOSIS — H43812 Vitreous degeneration, left eye: Secondary | ICD-10-CM | POA: Diagnosis not present

## 2021-09-23 DIAGNOSIS — H04123 Dry eye syndrome of bilateral lacrimal glands: Secondary | ICD-10-CM | POA: Diagnosis not present

## 2021-09-23 LAB — HM DIABETES EYE EXAM

## 2021-09-25 ENCOUNTER — Encounter: Payer: Self-pay | Admitting: Family Medicine

## 2021-10-21 DIAGNOSIS — C434 Malignant melanoma of scalp and neck: Secondary | ICD-10-CM | POA: Diagnosis not present

## 2021-11-24 DIAGNOSIS — L570 Actinic keratosis: Secondary | ICD-10-CM | POA: Diagnosis not present

## 2021-11-24 DIAGNOSIS — L821 Other seborrheic keratosis: Secondary | ICD-10-CM | POA: Diagnosis not present

## 2021-11-24 DIAGNOSIS — Z85828 Personal history of other malignant neoplasm of skin: Secondary | ICD-10-CM | POA: Diagnosis not present

## 2021-11-24 DIAGNOSIS — Z85820 Personal history of malignant melanoma of skin: Secondary | ICD-10-CM | POA: Diagnosis not present

## 2022-01-06 ENCOUNTER — Ambulatory Visit (INDEPENDENT_AMBULATORY_CARE_PROVIDER_SITE_OTHER): Payer: Medicare HMO | Admitting: Family Medicine

## 2022-01-06 ENCOUNTER — Other Ambulatory Visit: Payer: Self-pay

## 2022-01-06 VITALS — BP 138/70 | HR 61 | Temp 97.7°F | Wt 166.2 lb

## 2022-01-06 DIAGNOSIS — C434 Malignant melanoma of scalp and neck: Secondary | ICD-10-CM

## 2022-01-06 DIAGNOSIS — E1159 Type 2 diabetes mellitus with other circulatory complications: Secondary | ICD-10-CM

## 2022-01-06 DIAGNOSIS — E1121 Type 2 diabetes mellitus with diabetic nephropathy: Secondary | ICD-10-CM | POA: Diagnosis not present

## 2022-01-06 DIAGNOSIS — I152 Hypertension secondary to endocrine disorders: Secondary | ICD-10-CM | POA: Diagnosis not present

## 2022-01-06 DIAGNOSIS — E1169 Type 2 diabetes mellitus with other specified complication: Secondary | ICD-10-CM

## 2022-01-06 DIAGNOSIS — E785 Hyperlipidemia, unspecified: Secondary | ICD-10-CM

## 2022-01-06 DIAGNOSIS — J302 Other seasonal allergic rhinitis: Secondary | ICD-10-CM

## 2022-01-06 LAB — POCT GLYCOSYLATED HEMOGLOBIN (HGB A1C): Hemoglobin A1C: 7.1 % — AB (ref 4.0–5.6)

## 2022-01-06 NOTE — Progress Notes (Signed)
°  Subjective:    Patient ID: Maxwell Sanders, male    DOB: 1941/08/01, 81 y.o.   MRN: [id number]  Maxwell Sanders is a 81 y.o. male who presents for follow-up of Type 2 diabetes mellitus.  Home blood sugar records:  fasting Lowest reading 100 highest 160 Current symptoms/problems include none and have been improving. Daily foot checks:yes   Any foot concerns: none Exercise:  staying active Diet:good He continues on metformin as well as lisinopril/HCTZ, metoprolol.  He is also taking simvastatin and having no difficulty with that.  He keeps himself physically active.  He does state that he is having intermittent issues with decreased stream and frequency usually only about 50% of the time.  He does have underlying allergies.  He also has history of melanoma and follows up regularly with dermatology. The following portions of the patient's history were reviewed and updated as appropriate: allergies, current medications, past medical history, past social history and problem list.  ROS as in subjective above.     Objective:    Physical Exam Alert and in no distress otherwise not examined. Hemoglobin A1c is 7.1  Lab Review Diabetic Labs Latest Ref Rng & Units 09/15/2021 04/21/2021 12/16/2020 08/14/2020 04/23/2020  HbA1c 4.0 - 5.6 % 6.6(A) 6.6(A) 6.9(A) 6.7(A) 7.2(A)  Microalbumin mg/L - 14.3 - - -  Micro/Creat Ratio - - 35.4 - - -  Chol 100 - 199 mg/dL 150 - - - -  HDL >39 mg/dL 56 - - - -  Calc LDL 0 - 99 mg/dL 82 - - - -  Triglycerides 0 - 149 mg/dL 55 - - - -  Creatinine 0.76 - 1.27 mg/dL 0.79 - - 0.80 -   BP/Weight 09/15/2021 04/21/2021 01/06/2021 12/16/2020 2/94/7654  Systolic BP 650 354 656 812 751  Diastolic BP 82 66 60 78 74  Wt. (Lbs) 165.8 162.2 164 160.4 160.8  BMI 26.76 25.03 24.94 25.12 25.18   Foot/eye exam completion dates Latest Ref Rng & Units 09/23/2021 09/15/2021  Eye Exam No Retinopathy No Retinopathy -  Foot Form Completion - - Done    S[place]  reports that he has quit  smoking. He has never used smokeless tobacco. He reports that he does not drink alcohol and does not use drugs.     Assessment & Plan:    Type 2 diabetes mellitus with other specified complication, without long-term current use of insulin (HCC) - Plan: POCT glycosylated hemoglobin (Hb A1C)  Hyperlipidemia associated with type 2 diabetes mellitus ([place])  Diabetic nephropathy associated with type 2 diabetes mellitus ([place])  Hypertension associated with diabetes ([place])  Seasonal allergic rhinitis, unspecified trigger  Melanoma of scalp (HCC)   Rx changes: none Education: Reviewed ABCs of diabetes management (respective goals in parentheses):  A1C (<7), blood pressure (<130/80), and cholesterol (LDL <100). Compliance at present is estimated to be good. Efforts to improve compliance (if necessary) will be directed at  dietary modification as he did indicate he was a little slack this winter. . Follow up: 4 months  I discussed the urinary symptoms he is having and at this time he is not interested in pursuing this any further.  If it starts interfering with the quality of life, he will let me know.

## 2022-01-12 ENCOUNTER — Ambulatory Visit: Payer: Medicare HMO | Admitting: Cardiovascular Disease

## 2022-01-12 ENCOUNTER — Other Ambulatory Visit: Payer: Self-pay

## 2022-01-12 ENCOUNTER — Encounter: Payer: Self-pay | Admitting: Cardiovascular Disease

## 2022-01-12 VITALS — BP 140/72 | HR 63 | Ht 68.0 in | Wt 163.6 lb

## 2022-01-12 DIAGNOSIS — E785 Hyperlipidemia, unspecified: Secondary | ICD-10-CM | POA: Diagnosis not present

## 2022-01-12 DIAGNOSIS — I10 Essential (primary) hypertension: Secondary | ICD-10-CM | POA: Diagnosis not present

## 2022-01-12 DIAGNOSIS — I739 Peripheral vascular disease, unspecified: Secondary | ICD-10-CM

## 2022-01-12 MED ORDER — ATORVASTATIN CALCIUM 40 MG PO TABS: 40.0000 mg | ORAL_TABLET | Freq: Every day | ORAL | 3 refills | Status: DC

## 2022-01-12 NOTE — Progress Notes (Signed)
Cardiology Office Note   Date:  01/12/2022   ID:  Maxwell Sanders, DOB Jul 04, 1941, MRN [id number]  PCP:  Maxwell Lung, MD  Cardiologist:   Maxwell Sacramento, MD   No chief complaint on file.     History of Present Illness: Maxwell Sanders is a 81 y.o. male who is here today for follow-up visit regarding peripheral arterial disease.   He has known history of type 2 diabetes with peripheral neuropathy, hyperlipidemia, essential hypertension, previous remote tobacco use, BPH, erectile dysfunction and peptic ulcer disease. He has known moderately reduced ABI on the left side with occluded popliteal artery.  He is being treated medically. He has been doing well with no recent chest pain, shortness of breath or palpitations.  He reports mild left calf claudication that does not seem to be lifestyle limiting.   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 Wynetta Emery   shave Left    malignant melanoma desmoplastic   Shave Left 05/05/2021   well differentiated squamous cell carinoma, base involved   SKIN BIOPSY Right 02/08/2020   well differentiated squamous cell carcinoma     Current Outpatient Medications  Medication Sig Dispense Refill   aspirin 81 MG chewable tablet Chew by mouth daily.     Cholecalciferol (VITAMIN D3) 3000 units TABS Take by mouth.     Glucosamine-Chondroit-Vit C-Mn (GLUCOSAMINE 1500 COMPLEX PO) Take by mouth.     Lancets (ONETOUCH DELICA PLUS [id number]) MISC USE 1  TO CHECK GLUCOSE ONCE DAILY 100  each 0   lisinopril-hydrochlorothiazide (ZESTORETIC) 20-12.5 MG tablet Take 1 tablet by mouth daily. 90 tablet 3   metFORMIN (GLUCOPHAGE) 500 MG tablet Take 1 tablet (500 mg total) by mouth 2 (two) times daily with a meal. 180 tablet 1   metoprolol tartrate (LOPRESSOR) 100 MG tablet Take 1 tablet (100 mg total) by mouth 2 (two) times daily. 180 tablet 3   MICROLET LANCETS MISC Daily 100 each PRN   Multiple Vitamins-Minerals (MULTIVITAMIN WITH MINERALS) tablet Take 1 tablet by mouth daily.     Omega-3 Fatty Acids (FISH OIL OMEGA-3 PO) Take by mouth.     ONETOUCH VERIO test strip USE 1 STRIP TO CHECK GLUCOSE AS NEEDED AS DIRECTED 100 each 0   oxyCODONE (OXY IR/ROXICODONE) 5 MG immediate release tablet Take by mouth.     sodium fluoride (FLUORISHIELD) 1.1 % GEL dental gel See admin instructions.     tadalafil (CIALIS) 20 MG tablet Take 1 tablet (20 mg total) by mouth 2 (two) times a week. 90 tablet 1   No current facility-administered medications for this visit.    Allergies:   Penicillins    Social History:  The patient  reports that he has quit smoking. He has never used smokeless tobacco. He reports that he does not drink alcohol and does not use drugs.   Family History:  The patient's family history is  negative for coronary artery disease.   ROS:  Please see the history of present illness.   Otherwise, review of systems are positive for none.   All other systems are reviewed and negative.    PHYSICAL EXAM: VS:  BP 140/72    Pulse 63    Ht 5\' 8"  (1.727 m)    Wt 163 lb 9.6 oz (74.2 kg)    SpO2 98%    BMI 24.88 kg/m  , BMI Body mass index is 24.88 kg/m. GEN: Well nourished, well developed, in no acute distress  HEENT: normal  Neck: no JVD, carotid bruits, or masses Cardiac: RRR; no murmurs, rubs, or gallops,no edema  Respiratory:  clear to auscultation bilaterally, normal work of breathing GI: soft, nontender, nondistended, + BS MS: no deformity or atrophy  Skin: warm and dry, no  rash Neuro:  Strength and sensation are intact Psych: euthymic mood, full affect Vascular: Pedal pulses are palpable on the right side but not the left side.  No lower extremity ulceration.   EKG:  EKG is ordered today. The ekg ordered today demonstrates normal sinus rhythm with early repolarization  Recent Labs: 09/15/2021: ALT 19; BUN 18; Creatinine, Ser 0.79; Hemoglobin 13.2; Platelets 244; Potassium 4.4; Sodium 138    Lipid Panel    Component Value Date/Time   CHOL 150 09/15/2021 1102   TRIG 55 09/15/2021 1102   HDL 56 09/15/2021 1102   CHOLHDL 2.7 09/15/2021 1102   CHOLHDL 2.6 07/27/2017 1003   VLDL 15 07/22/2016 1013   LDLCALC 82 09/15/2021 1102   LDLCALC 70 07/27/2017 1003      Wt Readings from Last 3 Encounters:  01/12/22 163 lb 9.6 oz (74.2 kg)  01/06/22 166 lb 3.2 oz (75.4 kg)  09/15/21 165 lb 12.8 oz (75.2 kg)       No flowsheet data found.    ASSESSMENT AND PLAN:  1.  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  He has mild left calf claudication that is not lifestyle limiting at the present time.  Recommend continuing medical therapy.    2.  Essential hypertension: Blood pressure is well controlled on current medications.  3.  Hyperlipidemia: I reviewed most recent lipid profile done in October which showed an LDL of 82.  Given that he is diabetic and has peripheral arterial disease, recommend achieving a target LDL of less than 70 and preferably less than 55.  Thus, I elected to switch him from simvastatin to atorvastatin 40 mg daily.  Recheck lipid and liver profile in 2 months.  4.  Abnormal EKG with J-point elevation in the anterolateral leads.  He has no symptoms suggestive of cardiac disease.    Disposition:   FU with me in 1 year  Signed,  Maxwell Sacramento, MD  01/12/2022 10:24 AM    [place]

## 2022-01-12 NOTE — Patient Instructions (Signed)
Medication Instructions:  STOP the Simvastatin  START Atorvastatin 40 mg once daily  *If you need a refill on your cardiac medications before your next appointment, please call your pharmacy*   Lab Work: Your provider would like for you to return in 2 months to have the following labs drawn: Lipid and Liver. You do not need an appointment for the lab. Once in our office lobby there is a podium where you can sign in and ring the doorbell to alert [place] that you are here. The lab is open from 8:00 am to 4:30 pm; closed for lunch from 12:45pm-1:45pm.  If you have labs (blood work) drawn today and your tests are completely normal, you will receive your results only by: [facility] (if you have MyChart) OR A paper copy in the mail If you have any lab test that is abnormal or we need to change your treatment, we will call you to review the results.   Testing/Procedures: None ordered   Follow-Up: At [facility] you and your health needs are our priority.  As part of our continuing mission to provide you with exceptional heart care, we have created designated Provider Care Teams.  These Care Teams include your primary Cardiologist (physician) and Advanced Practice Providers (APPs -  Physician Assistants and Nurse Practitioners) who all work together to provide you with the care you need, when you need it.  We recommend signing up for the patient portal called "MyChart".  Sign up information is provided on this After Visit Summary.  MyChart is used to connect with patients for Virtual Visits (Telemedicine).  Patients are able to view lab/test results, encounter notes, upcoming appointments, etc.  Non-urgent messages can be sent to your provider as well.   To learn more about what you can do with MyChart, go to [email or web address].    Your next appointment:   12 month(s)  The format for your next appointment:   In Person  Provider:   Dr. Fletcher Anon

## 2022-02-02 ENCOUNTER — Other Ambulatory Visit: Payer: Self-pay | Admitting: Family Medicine

## 2022-02-02 DIAGNOSIS — N528 Other male erectile dysfunction: Secondary | ICD-10-CM

## 2022-02-02 DIAGNOSIS — N401 Enlarged prostate with lower urinary tract symptoms: Secondary | ICD-10-CM

## 2022-02-02 NOTE — Telephone Encounter (Signed)
[facility] is requesting to fill pt tadalafil. Please advise KH 

## 2022-02-23 DIAGNOSIS — Z85820 Personal history of malignant melanoma of skin: Secondary | ICD-10-CM | POA: Diagnosis not present

## 2022-02-23 DIAGNOSIS — Z85828 Personal history of other malignant neoplasm of skin: Secondary | ICD-10-CM | POA: Diagnosis not present

## 2022-02-23 DIAGNOSIS — L821 Other seborrheic keratosis: Secondary | ICD-10-CM | POA: Diagnosis not present

## 2022-02-23 DIAGNOSIS — L570 Actinic keratosis: Secondary | ICD-10-CM | POA: Diagnosis not present

## 2022-02-23 DIAGNOSIS — L905 Scar conditions and fibrosis of skin: Secondary | ICD-10-CM | POA: Diagnosis not present

## 2022-04-13 DIAGNOSIS — I739 Peripheral vascular disease, unspecified: Secondary | ICD-10-CM | POA: Diagnosis not present

## 2022-04-13 DIAGNOSIS — E785 Hyperlipidemia, unspecified: Secondary | ICD-10-CM | POA: Diagnosis not present

## 2022-04-13 LAB — LIPID PANEL
Chol/HDL Ratio: 2.1 ratio (ref 0.0–5.0)
Cholesterol, Total: 111 mg/dL (ref 100–199)
HDL: 52 mg/dL (ref >39)
LDL Chol Calc (NIH): 46 mg/dL (ref 0–99)
Triglycerides: 57 mg/dL (ref 0–149)
VLDL Cholesterol Cal: 13 mg/dL (ref 5–40)

## 2022-04-13 LAB — HEPATIC FUNCTION PANEL
ALT: 26 IU/L (ref 0–44)
AST: 25 IU/L (ref 0–40)
Albumin: 4.4 g/dL (ref 3.6–4.6)
Alkaline Phosphatase: 80 IU/L (ref 44–121)
Bilirubin Total: 0.5 mg/dL (ref 0.0–1.2)
Bilirubin, Direct: 0.18 mg/dL (ref 0.00–0.40)
Total Protein: 6.7 g/dL (ref 6.0–8.5)

## 2022-04-28 DIAGNOSIS — C434 Malignant melanoma of scalp and neck: Secondary | ICD-10-CM | POA: Diagnosis not present

## 2022-05-11 ENCOUNTER — Encounter: Payer: Self-pay | Admitting: Family Medicine

## 2022-05-11 ENCOUNTER — Ambulatory Visit (INDEPENDENT_AMBULATORY_CARE_PROVIDER_SITE_OTHER): Payer: Medicare HMO | Admitting: Family Medicine

## 2022-05-11 VITALS — BP 152/60 | HR 58 | Temp 97.9°F | Wt 160.8 lb

## 2022-05-11 DIAGNOSIS — D485 Neoplasm of uncertain behavior of skin: Secondary | ICD-10-CM | POA: Diagnosis not present

## 2022-05-11 DIAGNOSIS — E1121 Type 2 diabetes mellitus with diabetic nephropathy: Secondary | ICD-10-CM

## 2022-05-11 DIAGNOSIS — E1169 Type 2 diabetes mellitus with other specified complication: Secondary | ICD-10-CM

## 2022-05-11 DIAGNOSIS — N528 Other male erectile dysfunction: Secondary | ICD-10-CM

## 2022-05-11 DIAGNOSIS — I152 Hypertension secondary to endocrine disorders: Secondary | ICD-10-CM | POA: Diagnosis not present

## 2022-05-11 DIAGNOSIS — E1159 Type 2 diabetes mellitus with other circulatory complications: Secondary | ICD-10-CM

## 2022-05-11 DIAGNOSIS — C434 Malignant melanoma of scalp and neck: Secondary | ICD-10-CM

## 2022-05-11 DIAGNOSIS — N401 Enlarged prostate with lower urinary tract symptoms: Secondary | ICD-10-CM

## 2022-05-11 DIAGNOSIS — D692 Other nonthrombocytopenic purpura: Secondary | ICD-10-CM

## 2022-05-11 DIAGNOSIS — E785 Hyperlipidemia, unspecified: Secondary | ICD-10-CM

## 2022-05-11 DIAGNOSIS — Z85820 Personal history of malignant melanoma of skin: Secondary | ICD-10-CM | POA: Diagnosis not present

## 2022-05-11 DIAGNOSIS — C44622 Squamous cell carcinoma of skin of right upper limb, including shoulder: Secondary | ICD-10-CM | POA: Diagnosis not present

## 2022-05-11 LAB — POCT UA - MICROALBUMIN
Albumin/Creatinine Ratio, Urine, POC: 61.7
Creatinine, POC: 66.8 mg/dL
Microalbumin Ur, POC: 41.2 mg/L

## 2022-05-11 LAB — POCT GLYCOSYLATED HEMOGLOBIN (HGB A1C): Hemoglobin A1C: 6.9 % — AB (ref 4.0–5.6)

## 2022-05-11 MED ORDER — TADALAFIL 20 MG PO TABS: ORAL_TABLET | ORAL | 0 refills | Status: DC

## 2022-05-11 NOTE — Progress Notes (Signed)
Subjective:    Patient ID: Maxwell Sanders, male    DOB: 1941-01-24, 81 y.o.   MRN: [id number]  Maxwell Sanders is a 81 y.o. male who presents for follow-up of Type 2 diabetes mellitus.  Home blood sugar records:  fasting a couple times a week Current symptoms/problems include none and have been stable. Daily foot checks:yes   Any foot concerns: none at this time Exercise:  staying active at least 20 min QD Diet: good He continues on metformin and is having no difficulty with that.  He does use tadalafil roughly every 4 days to help with his BPH symptoms.  He does complain of a lesion in the gluteal area and intermittently gives him some difficulty but apparently has never drained anything.  He follows up regularly with dermatology for his underlying malignant melanoma and other skin conditions.  He sees cardiology regularly.  He is on lisinopril/HCTZ as well as metoprolol. The following portions of the patient's history were reviewed and updated as appropriate: allergies, current medications, past medical history, past social history and problem list.  ROS as in subjective above.     Objective:    Physical Exam Alert and in no distress purpuric lesions noted especially on the right forearm. Exam of the gluteal area does show a dried slightly tender lesion in the superior aspect.  It is not hot or red. Previous blood work was reviewed.  Hemoglobin A1c is 6.9 Lab Review    Latest Ref Rng & Units 04/13/2022   10:04 AM 01/06/2022    1:04 PM 09/15/2021   11:02 AM 09/15/2021   11:00 AM 04/21/2021   12:12 PM  Diabetic Labs  HbA1c 4.0 - 5.6 %  7.1   6.6    Microalbumin mg/L     14.3   Micro/Creat Ratio      35.4   Chol 100 - 199 mg/dL 111   150     HDL >39 mg/dL 52   56     Calc LDL 0 - 99 mg/dL 46   82     Triglycerides 0 - 149 mg/dL 57   55     Creatinine 0.76 - 1.27 mg/dL   0.79         01/12/2022   10:04 AM 01/06/2022   11:00 AM 09/15/2021   10:41 AM 04/21/2021   10:33 AM  01/06/2021   11:03 AM  BP/Weight  Systolic BP 416 606 301 601 093  Diastolic BP 72 70 82 66 60  Wt. (Lbs) 163.6 166.2 165.8 162.2 164  BMI 24.88 kg/m2 26.83 kg/m2 26.76 kg/m2 25.03 kg/m2 24.94 kg/m2      Latest Ref Rng & Units 09/23/2021   12:00 AM 09/15/2021   10:30 AM  Foot/eye exam completion dates  Eye Exam No Retinopathy No Retinopathy       Foot Form Completion   Done     This result is from an external source.    Maxwell Sanders  reports that he has quit smoking. He has never used smokeless tobacco. He reports that he does not drink alcohol and does not use drugs.     Assessment & Plan:    Type 2 diabetes mellitus with other specified complication, without long-term current use of insulin (HCC) - Plan: POCT glycosylated hemoglobin (Hb A1C), POCT UA - Microalbumin  Hyperlipidemia associated with type 2 diabetes mellitus ([place])  Diabetic nephropathy associated with type 2 diabetes mellitus ([place])  Melanoma of scalp ([place])  Hypertension associated with  diabetes [facility])  Other male erectile dysfunction - Plan: tadalafil (CIALIS) 20 MG tablet  Benign prostatic hyperplasia with lower urinary tract symptoms, symptom details unspecified - Plan: tadalafil (CIALIS) 20 MG tablet  Senile purpura (HCC)  Rx changes: none Education: Reviewed 'ABCs' of diabetes management (respective goals in parentheses):  A1C (<7), blood pressure (<130/80), and cholesterol (LDL <100). Compliance at present is estimated to be good. Efforts to improve compliance (if necessary) will be directed at  no change . Follow up: 6 months   for complete exam. Also recommend heat and Neosporin for the lesion in the pilonidal area. Presently I would not do any of the conservative care but this could easily be a pilonidal cyst that becomes intermittently infected.

## 2022-05-12 HISTORY — PX: SKIN BIOPSY: SHX1

## 2022-05-17 DIAGNOSIS — D0460 Carcinoma in situ of skin of unspecified upper limb, including shoulder: Secondary | ICD-10-CM | POA: Insufficient documentation

## 2022-06-02 ENCOUNTER — Other Ambulatory Visit: Payer: Self-pay | Admitting: Family Medicine

## 2022-06-02 DIAGNOSIS — E118 Type 2 diabetes mellitus with unspecified complications: Secondary | ICD-10-CM

## 2022-06-29 DIAGNOSIS — Z85828 Personal history of other malignant neoplasm of skin: Secondary | ICD-10-CM | POA: Diagnosis not present

## 2022-06-29 DIAGNOSIS — Z85820 Personal history of malignant melanoma of skin: Secondary | ICD-10-CM | POA: Diagnosis not present

## 2022-06-29 DIAGNOSIS — D485 Neoplasm of uncertain behavior of skin: Secondary | ICD-10-CM | POA: Diagnosis not present

## 2022-06-29 DIAGNOSIS — C4442 Squamous cell carcinoma of skin of scalp and neck: Secondary | ICD-10-CM | POA: Diagnosis not present

## 2022-06-29 DIAGNOSIS — L720 Epidermal cyst: Secondary | ICD-10-CM | POA: Diagnosis not present

## 2022-06-29 DIAGNOSIS — L812 Freckles: Secondary | ICD-10-CM | POA: Diagnosis not present

## 2022-06-29 DIAGNOSIS — L821 Other seborrheic keratosis: Secondary | ICD-10-CM | POA: Diagnosis not present

## 2022-06-29 DIAGNOSIS — L280 Lichen simplex chronicus: Secondary | ICD-10-CM | POA: Diagnosis not present

## 2022-06-29 DIAGNOSIS — L570 Actinic keratosis: Secondary | ICD-10-CM | POA: Diagnosis not present

## 2022-07-21 DIAGNOSIS — Z85820 Personal history of malignant melanoma of skin: Secondary | ICD-10-CM | POA: Diagnosis not present

## 2022-07-21 DIAGNOSIS — C4442 Squamous cell carcinoma of skin of scalp and neck: Secondary | ICD-10-CM | POA: Diagnosis not present

## 2022-07-28 ENCOUNTER — Encounter: Payer: Self-pay | Admitting: Internal Medicine

## 2022-08-12 ENCOUNTER — Telehealth: Payer: Self-pay | Admitting: Family Medicine

## 2022-08-12 NOTE — Telephone Encounter (Signed)
Left message for patient to call back and schedule Medicare Annual Wellness Visit (AWV) either virtually or in office. I left my number for patient to call [phone].  Last AWV 09/15/21  please schedule at anytime with health coach  I wanted to see if patient would schedule AWV w/nickeah prior to appt w.pcp

## 2022-08-28 ENCOUNTER — Other Ambulatory Visit: Payer: Self-pay | Admitting: Family Medicine

## 2022-08-28 DIAGNOSIS — E118 Type 2 diabetes mellitus with unspecified complications: Secondary | ICD-10-CM

## 2022-08-31 ENCOUNTER — Encounter: Payer: Self-pay | Admitting: Internal Medicine

## 2022-09-13 ENCOUNTER — Encounter: Payer: Self-pay | Admitting: Internal Medicine

## 2022-09-17 ENCOUNTER — Ambulatory Visit (INDEPENDENT_AMBULATORY_CARE_PROVIDER_SITE_OTHER): Payer: Medicare HMO

## 2022-09-17 VITALS — BP 102/60 | HR 64 | Temp 97.7°F | Ht 67.0 in | Wt 155.8 lb

## 2022-09-17 DIAGNOSIS — Z0000 Encounter for general adult medical examination without abnormal findings: Secondary | ICD-10-CM

## 2022-09-17 DIAGNOSIS — Z23 Encounter for immunization: Secondary | ICD-10-CM

## 2022-09-17 NOTE — Progress Notes (Signed)
Subjective:   Maxwell Sanders is a 81 y.o. male who presents for Medicare Annual/Subsequent preventive examination.  Review of Systems     Cardiac Risk Factors include: advanced age (>74mn, >>40women);diabetes mellitus;dyslipidemia;hypertension;male gender     Objective:    Today's Vitals   09/17/22 0949  BP: 102/60  Pulse: 64  Temp: 97.7 F (36.5 C)  TempSrc: Oral  SpO2: 96%  Weight: 155 lb 12.8 oz (70.7 kg)  Height: '5\' 7"'$  (1.702 m)   Body mass index is 24.4 kg/m.     09/17/2022    9:59 AM 09/15/2021   10:26 AM 08/14/2020   10:14 AM 08/02/2018   10:11 AM 07/22/2016    9:35 AM 07/08/2014   10:51 AM  Advanced Directives  Does Patient Have a Medical Advance Directive? Yes Yes Yes Yes Yes Yes  Type of [place]of [place] will Living will;Healthcare Power of Attorney Living [place] Living will  Does patient want to make changes to medical advance directive?    No - Patient declined    Copy of [place] Chart? No - copy requested No - copy requested  No - copy requested No - copy requested No - copy requested    Current Medications (verified) Outpatient Encounter Medications as of 09/17/2022  Medication Sig   aspirin 81 MG chewable tablet Chew by mouth daily.   Cholecalciferol (VITAMIN D3) 3000 units TABS Take by mouth.   Glucosamine-Chondroit-Vit C-Mn (GLUCOSAMINE 1500 COMPLEX PO) Take by mouth.   Lancets (ONETOUCH DELICA PLUS [id number] MISC USE 1  TO CHECK GLUCOSE ONCE DAILY   lisinopril-hydrochlorothiazide (ZESTORETIC) 20-12.5 MG tablet Take 1 tablet by mouth daily.   metFORMIN (GLUCOPHAGE) 500 MG tablet TAKE 1 TABLET BY MOUTH TWICE DAILY WITH A MEAL   metoprolol tartrate (LOPRESSOR) 100 MG tablet Take 1 tablet (100 mg total) by mouth 2 (two) times daily.   MICROLET LANCETS MISC Daily   Multiple Vitamins-Minerals (MULTIVITAMIN WITH  MINERALS) tablet Take 1 tablet by mouth daily.   Omega-3 Fatty Acids (FISH OIL OMEGA-3 PO) Take by mouth.   ONETOUCH VERIO test strip USE 1 STRIP TO CHECK GLUCOSE AS NEEDED AS DIRECTED   sodium fluoride (FLUORISHIELD) 1.1 % GEL dental gel See admin instructions.   tadalafil (CIALIS) 20 MG tablet TAKE 1 TABLET BY MOUTH TWICE A WEEK   atorvastatin (LIPITOR) 40 MG tablet Take 1 tablet (40 mg total) by mouth daily.   oxyCODONE (OXY IR/ROXICODONE) 5 MG immediate release tablet Take by mouth. (Patient not taking: Reported on 05/11/2022)   No facility-administered encounter medications on file as of 09/17/2022.    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 JWynetta Emery  shave Left    malignant melanoma desmoplastic   Shave Left 05/05/2021   well differentiated squamous cell carinoma,  base involved   SKIN BIOPSY Right 02/08/2020   well differentiated squamous cell carcinoma   SKIN BIOPSY Right 05/12/2022   well diff squmaous cell carcinoma , based involved   History reviewed. No pertinent family history. Social History   Socioeconomic History   Marital status: Married    Spouse name: Not on file   Number of children: Not on file   Years of education: Not on file   Highest education level: Not on file  Occupational History   Not on file  Tobacco Use   Smoking status: Former   Smokeless tobacco: Never  Vaping Use   Vaping Use: Never used  Substance and Sexual Activity    Alcohol use: No   Drug use: No   Sexual activity: Not Currently  Other Topics Concern   Not on file  Social History Narrative   Not on file   Social Determinants of Health   Financial Resource Strain: Low Risk  (09/17/2022)   Overall Financial Resource Strain (CARDIA)    Difficulty of Paying Living Expenses: Not hard at all  Food Insecurity: No Food Insecurity (09/17/2022)   Hunger Vital Sign    Worried About Running Out of Food in the Last Year: Never true    [place] in the Last Year: Never true  Transportation Needs: No Transportation Needs (09/17/2022)   PRAPARE - Hydrologist (Medical): No    Lack of Transportation (Non-Medical): No  Physical Activity: Inactive (09/17/2022)   Exercise Vital Sign    Days of Exercise per Week: 0 days    Minutes of Exercise per Session: 0 min  Stress: No Stress Concern Present (09/17/2022)   [place]    Feeling of Stress : Not at all  Social Connections: Not on file    Tobacco Counseling Counseling given: Not Answered   Clinical Intake:  Pre-visit preparation completed: Yes  Pain : No/denies pain     Nutritional Status: BMI of 19-24  Normal Nutritional Risks: None Diabetes: Yes  How often do you need to have someone help you when you read instructions, pamphlets, or other written materials from your doctor or pharmacy?: 1 - Never  Diabetic? no  Interpreter Needed?: No  Information entered by :: NAllen LPN   Activities of Daily Living    09/17/2022   10:02 AM  In your present state of health, do you have any difficulty performing the following activities:  Hearing? 0  Vision? 0  Difficulty concentrating or making decisions? 1  Walking or climbing stairs? 0  Dressing or bathing? 0  Doing errands, shopping? 0  Preparing Food and eating ? N  Using the Toilet? N  In the past six months, have you accidently leaked  urine? N  Do you have problems with loss of bowel control? N  Managing your Medications? N  Managing your Finances? N  Housekeeping or managing your Housekeeping? N    Patient Care Team: Denita Lung, MD as PCP - General (Family Medicine)  Indicate any recent Medical Services you may have received from other than [facility] providers in the past year (date may be approximate).     Assessment:   This is a routine wellness examination for Maxwell Sanders.  Hearing/Vision screen Vision Screening - Comments:: Regular eye exams, [facility]  Dietary issues and exercise activities discussed: Current Exercise Habits: The patient does not participate in regular exercise at present  Goals Addressed             This Visit's Progress    Patient Stated       09/17/2022, stay alive       Depression Screen    09/17/2022   10:00 AM 09/15/2021   10:28 AM 08/14/2020   10:08 AM 08/14/2019   10:34 AM 08/02/2018    9:37 AM 07/27/2017    9:41 AM 07/22/2016    8:53 AM  PHQ 2/9 Scores  PHQ - 2 Score 0 0 0 0 0 0 0  PHQ- 9 Score 0          Fall Risk    09/17/2022   10:00 AM 09/15/2021   10:27 AM 08/14/2020   10:08 AM 08/14/2019   10:34 AM 08/02/2018    9:37 AM  Fall Risk   Falls in the past year? 0 0 0 0 No  Number falls in past yr: 0 0     Injury with Fall? 0 0     Risk for fall due to : Medication side effect History of fall(s)     Follow up Falls prevention discussed;Falls evaluation completed;Education provided Falls evaluation completed       FALL RISK PREVENTION PERTAINING TO THE HOME:  Any stairs in or around the home? Yes  If so, are there any without handrails? No  Home free of loose throw rugs in walkways, pet beds, electrical cords, etc? Yes  Adequate lighting in your home to reduce risk of falls? Yes   ASSISTIVE DEVICES UTILIZED TO PREVENT FALLS:  Life alert? No  Use of a cane, walker or w/c? No  Grab bars in the bathroom? Yes  Shower chair or bench in shower? No   Elevated toilet seat or a handicapped toilet? Yes   TIMED UP AND GO:  Was the test performed? Yes .  Length of time to ambulate 10 feet: 6 sec.   Gait slow and steady without use of assistive device  Cognitive Function:        09/17/2022   10:08 AM  6CIT Screen  What Year? 0 points  What month? 0 points  What time? 0 points  Count back from 20 0 points  Months in reverse 0 points  Repeat phrase 4 points  Total Score 4 points    Immunizations Immunization History  Administered Date(s) Administered   DT (Pediatric) 11/07/2002   Fluad Quad(high Dose 65+) 08/14/2019, 08/14/2020, 09/15/2021   Influenza, High Dose Seasonal PF 12/02/2015, 07/22/2016, 07/27/2017, 12/12/2018   Influenza, Seasonal, Injecte, Preservative Fre 12/07/2012   PFIZER Comirnaty(Gray Top)Covid-19 Tri-Sucrose Vaccine 04/21/2021   PFIZER(Purple Top)SARS-COV-2 Vaccination 12/24/2019, 01/14/2020, 08/14/2020   Pfizer Covid-19 Vaccine Bivalent Booster 39yr & up 09/15/2021   Pneumococcal Conjugate-13 06/21/2013   Pneumococcal Polysaccharide-23 05/11/2006   Tdap 04/21/2012   Zoster Recombinat (Shingrix) 04/10/2018, 06/17/2018   Zoster, Live 03/14/2008    TDAP status: Due, Education has been provided regarding the importance of this vaccine. Advised may receive this vaccine at local pharmacy or Health Dept. Aware to provide a copy of the vaccination record if obtained from local pharmacy or Health Dept. Verbalized acceptance and understanding.  Flu Vaccine status: Completed at today's visit  Pneumococcal vaccine status: Up to date  Covid-19 vaccine status: Completed vaccines  Qualifies for Shingles Vaccine? Yes   Zostavax completed Yes   Shingrix Completed?: Yes  Screening Tests Health Maintenance  Topic Date Due   COVID-19 Vaccine (6 - Pfizer risk series) 11/10/2021  TETANUS/TDAP  04/21/2022   INFLUENZA VACCINE  06/22/2022   Diabetic kidney evaluation - GFR measurement  09/15/2022   Medicare  Annual Wellness (AWV)  09/15/2022   FOOT EXAM  09/15/2022   OPHTHALMOLOGY EXAM  09/23/2022   HEMOGLOBIN A1C  11/10/2022   Diabetic kidney evaluation - Urine ACR  05/12/2023   Pneumonia Vaccine 56+ Years old  Completed   Zoster Vaccines- Shingrix  Completed   HPV VACCINES  Aged Out    Health Maintenance  Health Maintenance Due  Topic Date Due   COVID-19 Vaccine (6 - [place] risk series) 11/10/2021   TETANUS/TDAP  04/21/2022   INFLUENZA VACCINE  06/22/2022   Diabetic kidney evaluation - GFR measurement  09/15/2022   Medicare Annual Wellness (AWV)  09/15/2022   FOOT EXAM  09/15/2022    Colorectal cancer screening: No longer required.   Lung Cancer Screening: (Low Dose CT Chest recommended if Age 70-80 years, 30 pack-year currently smoking OR have quit w/in 15years.) does not qualify.   Lung Cancer Screening Referral: no  Additional Screening:  Hepatitis C Screening: does not qualify;   Vision Screening: Recommended annual ophthalmology exams for early detection of glaucoma and other disorders of the eye. Is the patient up to date with their annual eye exam?  Yes  Who is the provider or what is the name of the office in which the patient attends annual eye exams? [facility]  If pt is not established with a provider, would they like to be referred to a provider to establish care? No .   Dental Screening: Recommended annual dental exams for proper oral hygiene  Community Resource Referral / Chronic Care Management: CRR required this visit?  No   CCM required this visit?  No      Plan:     I have personally reviewed and noted the following in the patient's chart:   Medical and social history Use of alcohol, tobacco or illicit drugs  Current medications and supplements including opioid prescriptions. Patient is not currently taking opioid prescriptions. Functional ability and status Nutritional status Physical activity Advanced directives List of other  physicians Hospitalizations, surgeries, and ER visits in previous 12 months Vitals Screenings to include cognitive, depression, and falls Referrals and appointments  In addition, I have reviewed and discussed with patient certain preventive protocols, quality metrics, and best practice recommendations. A written personalized care plan for preventive services as well as general preventive health recommendations were provided to patient.     Kellie Simmering, LPN   [id number]   Nurse Notes: none

## 2022-09-17 NOTE — Patient Instructions (Addendum)
[name] , Thank you for taking time to come for your Medicare Wellness Visit. I appreciate your ongoing commitment to your health goals. Please review the following plan we discussed and let me know if I can assist you in the future.   Screening recommendations/referrals: Colonoscopy: not required Recommended yearly ophthalmology/optometry visit for glaucoma screening and checkup Recommended yearly dental visit for hygiene and checkup  Vaccinations: Influenza vaccine: today Pneumococcal vaccine: completed 06/21/2013 Tdap vaccine: due Shingles vaccine: completed   Covid-19:  09/15/2021, 04/21/2021, 08/14/2020, 01/14/2020, 12/24/2019  Advanced directives: Please bring a copy of your POA (Power of Attorney) and/or Living Will to your next appointment.    Conditions/risks identified: none  Next appointment: Follow up in one year for your annual wellness visit.   Preventive Care 82 Years and Older, Male Preventive care refers to lifestyle choices and visits with your health care provider that can promote health and wellness. What does preventive care include? A yearly physical exam. This is also called an annual well check. Dental exams once or twice a year. Routine eye exams. Ask your health care provider how often you should have your eyes checked. Personal lifestyle choices, including: Daily care of your teeth and gums. Regular physical activity. Eating a healthy diet. Avoiding tobacco and drug use. Limiting alcohol use. Practicing safe sex. Taking low doses of aspirin every day. Taking vitamin and mineral supplements as recommended by your health care provider. What happens during an annual well check? The services and screenings done by your health care provider during your annual well check will depend on your age, overall health, lifestyle risk factors, and family history of disease. Counseling  Your health care provider may ask you questions about your: Alcohol use. Tobacco  use. Drug use. Emotional well-being. Home and relationship well-being. Sexual activity. Eating habits. History of falls. Memory and ability to understand (cognition). Work and work Statistician. Screening  You may have the following tests or measurements: Height, weight, and BMI. Blood pressure. Lipid and cholesterol levels. These may be checked every 5 years, or more frequently if you are over 77 years old. Skin check. Lung cancer screening. You may have this screening every year starting at age 78 if you have a 30-pack-year history of smoking and currently smoke or have quit within the past 15 years. Fecal occult blood test (FOBT) of the stool. You may have this test every year starting at age 87. Flexible sigmoidoscopy or colonoscopy. You may have a sigmoidoscopy every 5 years or a colonoscopy every 10 years starting at age 60. Prostate cancer screening. Recommendations will vary depending on your family history and other risks. Hepatitis C blood test. Hepatitis B blood test. Sexually transmitted disease (STD) testing. Diabetes screening. This is done by checking your blood sugar (glucose) after you have not eaten for a while (fasting). You may have this done every 1-3 years. Abdominal aortic aneurysm (AAA) screening. You may need this if you are a current or former smoker. Osteoporosis. You may be screened starting at age 36 if you are at high risk. Talk with your health care provider about your test results, treatment options, and if necessary, the need for more tests. Vaccines  Your health care provider may recommend certain vaccines, such as: Influenza vaccine. This is recommended every year. Tetanus, diphtheria, and acellular pertussis (Tdap, Td) vaccine. You may need a Td booster every 10 years. Zoster vaccine. You may need this after age 49. Pneumococcal 13-valent conjugate (PCV13) vaccine. One dose is recommended after  age 24. Pneumococcal polysaccharide (PPSV23) vaccine.  One dose is recommended after age 13. Talk to your health care provider about which screenings and vaccines you need and how often you need them. This information is not intended to replace advice given to you by your health care provider. Make sure you discuss any questions you have with your health care provider. Document Released: 12/05/2015 Document Revised: 07/28/2016 Document Reviewed: 09/09/2015 Elsevier Interactive Patient Education  2017 [employer] Prevention in the Home Falls can cause injuries. They can happen to people of all ages. There are many things you can do to make your home safe and to help prevent falls. What can I do on the outside of my home? Regularly fix the edges of walkways and driveways and fix any cracks. Remove anything that might make you trip as you walk through a door, such as a raised step or threshold. Trim any bushes or trees on the path to your home. Use bright outdoor lighting. Clear any walking paths of anything that might make someone trip, such as rocks or tools. Regularly check to see if handrails are loose or broken. Make sure that both sides of any steps have handrails. Any raised decks and porches should have guardrails on the edges. Have any leaves, snow, or ice cleared regularly. Use sand or salt on walking paths during winter. Clean up any spills in your garage right away. This includes oil or grease spills. What can I do in the bathroom? Use night lights. Install grab bars by the toilet and in the tub and shower. Do not use towel bars as grab bars. Use non-skid mats or decals in the tub or shower. If you need to sit down in the shower, use a plastic, non-slip stool. Keep the floor dry. Clean up any water that spills on the floor as soon as it happens. Remove soap buildup in the tub or shower regularly. Attach bath mats securely with double-sided non-slip rug tape. Do not have throw rugs and other things on the floor that can make  you trip. What can I do in the bedroom? Use night lights. Make sure that you have a light by your bed that is easy to reach. Do not use any sheets or blankets that are too big for your bed. They should not hang down onto the floor. Have a firm chair that has side arms. You can use this for support while you get dressed. Do not have throw rugs and other things on the floor that can make you trip. What can I do in the kitchen? Clean up any spills right away. Avoid walking on wet floors. Keep items that you use a lot in easy-to-reach places. If you need to reach something above you, use a strong step stool that has a grab bar. Keep electrical cords out of the way. Do not use floor polish or wax that makes floors slippery. If you must use wax, use non-skid floor wax. Do not have throw rugs and other things on the floor that can make you trip. What can I do with my stairs? Do not leave any items on the stairs. Make sure that there are handrails on both sides of the stairs and use them. Fix handrails that are broken or loose. Make sure that handrails are as long as the stairways. Check any carpeting to make sure that it is firmly attached to the stairs. Fix any carpet that is loose or worn. Avoid having throw rugs  at the top or bottom of the stairs. If you do have throw rugs, attach them to the floor with carpet tape. Make sure that you have a light switch at the top of the stairs and the bottom of the stairs. If you do not have them, ask someone to add them for you. What else can I do to help prevent falls? Wear shoes that: Do not have high heels. Have rubber bottoms. Are comfortable and fit you well. Are closed at the toe. Do not wear sandals. If you use a stepladder: Make sure that it is fully opened. Do not climb a closed stepladder. Make sure that both sides of the stepladder are locked into place. Ask someone to hold it for you, if possible. Clearly mark and make sure that you can  see: Any grab bars or handrails. First and last steps. Where the edge of each step is. Use tools that help you move around (mobility aids) if they are needed. These include: Canes. Walkers. Scooters. Crutches. Turn on the lights when you go into a dark area. Replace any light bulbs as soon as they burn out. Set up your furniture so you have a clear path. Avoid moving your furniture around. If any of your floors are uneven, fix them. If there are any pets around you, be aware of where they are. Review your medicines with your doctor. Some medicines can make you feel dizzy. This can increase your chance of falling. Ask your doctor what other things that you can do to help prevent falls. This information is not intended to replace advice given to you by your health care provider. Make sure you discuss any questions you have with your health care provider. Document Released: 09/04/2009 Document Revised: 04/15/2016 Document Reviewed: 12/13/2014 Elsevier Interactive Patient Education  2017 [employer].

## 2022-09-30 DIAGNOSIS — H04123 Dry eye syndrome of bilateral lacrimal glands: Secondary | ICD-10-CM | POA: Diagnosis not present

## 2022-09-30 DIAGNOSIS — H04521 Eversion of right lacrimal punctum: Secondary | ICD-10-CM | POA: Diagnosis not present

## 2022-09-30 DIAGNOSIS — Z961 Presence of intraocular lens: Secondary | ICD-10-CM | POA: Diagnosis not present

## 2022-09-30 DIAGNOSIS — H02135 Senile ectropion of left lower eyelid: Secondary | ICD-10-CM | POA: Diagnosis not present

## 2022-09-30 DIAGNOSIS — H02132 Senile ectropion of right lower eyelid: Secondary | ICD-10-CM | POA: Diagnosis not present

## 2022-09-30 DIAGNOSIS — T1512XA Foreign body in conjunctival sac, left eye, initial encounter: Secondary | ICD-10-CM | POA: Diagnosis not present

## 2022-09-30 DIAGNOSIS — D3131 Benign neoplasm of right choroid: Secondary | ICD-10-CM | POA: Diagnosis not present

## 2022-09-30 DIAGNOSIS — E119 Type 2 diabetes mellitus without complications: Secondary | ICD-10-CM | POA: Diagnosis not present

## 2022-09-30 DIAGNOSIS — H43812 Vitreous degeneration, left eye: Secondary | ICD-10-CM | POA: Diagnosis not present

## 2022-09-30 LAB — HM DIABETES EYE EXAM

## 2022-10-13 ENCOUNTER — Encounter: Payer: Self-pay | Admitting: *Deleted

## 2022-10-25 DIAGNOSIS — C434 Malignant melanoma of scalp and neck: Secondary | ICD-10-CM | POA: Diagnosis not present

## 2022-10-30 ENCOUNTER — Other Ambulatory Visit: Payer: Self-pay | Admitting: Family Medicine

## 2022-10-30 DIAGNOSIS — E1159 Type 2 diabetes mellitus with other circulatory complications: Secondary | ICD-10-CM

## 2022-11-03 DIAGNOSIS — Z85820 Personal history of malignant melanoma of skin: Secondary | ICD-10-CM | POA: Diagnosis not present

## 2022-11-03 DIAGNOSIS — L821 Other seborrheic keratosis: Secondary | ICD-10-CM | POA: Diagnosis not present

## 2022-11-03 DIAGNOSIS — L570 Actinic keratosis: Secondary | ICD-10-CM | POA: Diagnosis not present

## 2022-11-03 DIAGNOSIS — D1801 Hemangioma of skin and subcutaneous tissue: Secondary | ICD-10-CM | POA: Diagnosis not present

## 2022-11-03 DIAGNOSIS — D485 Neoplasm of uncertain behavior of skin: Secondary | ICD-10-CM | POA: Diagnosis not present

## 2022-11-03 DIAGNOSIS — D692 Other nonthrombocytopenic purpura: Secondary | ICD-10-CM | POA: Diagnosis not present

## 2022-11-03 DIAGNOSIS — D0439 Carcinoma in situ of skin of other parts of face: Secondary | ICD-10-CM | POA: Diagnosis not present

## 2022-11-03 DIAGNOSIS — L853 Xerosis cutis: Secondary | ICD-10-CM | POA: Diagnosis not present

## 2022-11-03 DIAGNOSIS — Z85828 Personal history of other malignant neoplasm of skin: Secondary | ICD-10-CM | POA: Diagnosis not present

## 2022-11-05 ENCOUNTER — Ambulatory Visit: Payer: Medicare HMO | Admitting: Family Medicine

## 2022-11-10 DIAGNOSIS — D0439 Carcinoma in situ of skin of other parts of face: Secondary | ICD-10-CM | POA: Insufficient documentation

## 2022-11-26 ENCOUNTER — Other Ambulatory Visit: Payer: Self-pay | Admitting: Family Medicine

## 2022-11-26 DIAGNOSIS — E118 Type 2 diabetes mellitus with unspecified complications: Secondary | ICD-10-CM

## 2022-12-01 ENCOUNTER — Other Ambulatory Visit: Payer: Self-pay | Admitting: Family Medicine

## 2022-12-01 DIAGNOSIS — E1159 Type 2 diabetes mellitus with other circulatory complications: Secondary | ICD-10-CM

## 2022-12-04 ENCOUNTER — Other Ambulatory Visit: Payer: Self-pay | Admitting: Family Medicine

## 2022-12-04 DIAGNOSIS — I152 Hypertension secondary to endocrine disorders: Secondary | ICD-10-CM

## 2022-12-13 ENCOUNTER — Ambulatory Visit (INDEPENDENT_AMBULATORY_CARE_PROVIDER_SITE_OTHER): Payer: Medicare HMO | Admitting: Family Medicine

## 2022-12-13 ENCOUNTER — Encounter: Payer: Self-pay | Admitting: Family Medicine

## 2022-12-13 VITALS — BP 130/74 | HR 59 | Temp 97.5°F | Resp 14 | Ht 66.25 in | Wt 155.8 lb

## 2022-12-13 DIAGNOSIS — Z23 Encounter for immunization: Secondary | ICD-10-CM | POA: Diagnosis not present

## 2022-12-13 DIAGNOSIS — E785 Hyperlipidemia, unspecified: Secondary | ICD-10-CM | POA: Diagnosis not present

## 2022-12-13 DIAGNOSIS — J302 Other seasonal allergic rhinitis: Secondary | ICD-10-CM

## 2022-12-13 DIAGNOSIS — E1169 Type 2 diabetes mellitus with other specified complication: Secondary | ICD-10-CM | POA: Diagnosis not present

## 2022-12-13 DIAGNOSIS — Z0000 Encounter for general adult medical examination without abnormal findings: Secondary | ICD-10-CM | POA: Diagnosis not present

## 2022-12-13 DIAGNOSIS — I152 Hypertension secondary to endocrine disorders: Secondary | ICD-10-CM | POA: Diagnosis not present

## 2022-12-13 DIAGNOSIS — C434 Malignant melanoma of scalp and neck: Secondary | ICD-10-CM

## 2022-12-13 DIAGNOSIS — D692 Other nonthrombocytopenic purpura: Secondary | ICD-10-CM | POA: Diagnosis not present

## 2022-12-13 DIAGNOSIS — N401 Enlarged prostate with lower urinary tract symptoms: Secondary | ICD-10-CM

## 2022-12-13 DIAGNOSIS — N528 Other male erectile dysfunction: Secondary | ICD-10-CM | POA: Diagnosis not present

## 2022-12-13 DIAGNOSIS — E1121 Type 2 diabetes mellitus with diabetic nephropathy: Secondary | ICD-10-CM | POA: Diagnosis not present

## 2022-12-13 DIAGNOSIS — E1159 Type 2 diabetes mellitus with other circulatory complications: Secondary | ICD-10-CM

## 2022-12-13 DIAGNOSIS — Z1211 Encounter for screening for malignant neoplasm of colon: Secondary | ICD-10-CM

## 2022-12-13 LAB — POCT UA - MICROALBUMIN
Albumin/Creatinine Ratio, Urine, POC: 85.3
Creatinine, POC: 57.2 mg/dL
Microalbumin Ur, POC: 48.8 mg/L

## 2022-12-13 LAB — POCT GLYCOSYLATED HEMOGLOBIN (HGB A1C): Hemoglobin A1C: 7.6 % — AB (ref 4.0–5.6)

## 2022-12-13 MED ORDER — LISINOPRIL-HYDROCHLOROTHIAZIDE 20-12.5 MG PO TABS: 1.0000 | ORAL_TABLET | Freq: Every day | ORAL | 0 refills | Status: DC

## 2022-12-13 MED ORDER — METOPROLOL TARTRATE 100 MG PO TABS: 100.0000 mg | ORAL_TABLET | Freq: Two times a day (BID) | ORAL | 0 refills | Status: DC

## 2022-12-13 MED ORDER — ATORVASTATIN CALCIUM 40 MG PO TABS: 40.0000 mg | ORAL_TABLET | Freq: Every day | ORAL | 3 refills | Status: DC

## 2022-12-13 MED ORDER — METFORMIN HCL 500 MG PO TABS: 500.0000 mg | ORAL_TABLET | Freq: Two times a day (BID) | ORAL | 0 refills | Status: DC

## 2022-12-13 NOTE — Progress Notes (Signed)
Complete physical exam  Patient: Maxwell Sanders   DOB: Nov 24, 1940   82 y.o. Male  MRN: [id number]  Subjective:    Chief Complaint  Patient presents with   Annual Exam    Annual physical exam. Had AWV with [facility] on 09/13/2022. Fasting. No additional concerns.     Maxwell Sanders is a 82 y.o. male who presents today for a complete physical exam. He reports consuming a general diet. Home exercise routine includes weight lifting. He generally feels fairly well. He reports sleeping poorly. He does concerns over colon cancer screening.  He would like to have another Cologuard test done.  He continues on metformin for his diabetes.  He is also taking metoprolol as well as lisinopril/HCTZ.  He is using Cialis 20 mg every 3 days mainly to help with bladder function.  His sexual activity is quite limited over the last several years.  He continues on Lipitor and having no difficulty with that.  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  He is comfortable with this regimen.  Allergies are under good control.   Most recent fall risk assessment:    12/13/2022   11:11 AM  Fall Risk   Falls in the past year? 0  Number falls in past yr: 0  Injury with Fall? 0  Risk for fall due to : No Fall Risks  Follow up Falls evaluation completed     Most recent depression screenings:    09/17/2022   10:00 AM 09/15/2021   10:28 AM  PHQ 2/9 Scores  PHQ - 2 Score 0 0  PHQ- 9 Score 0     Vision:Within last year and Dental: Receives regular dental care    Patient Care Team: Denita Lung, MD as PCP - General (Family Medicine)   Outpatient Medications Prior to Visit  Medication Sig   aspirin 81 MG chewable tablet Chew by mouth daily.   Cholecalciferol (VITAMIN D3) 3000 units TABS Take by mouth.   Glucosamine-Chondroit-Vit C-Mn (GLUCOSAMINE 1500 COMPLEX PO) Take by mouth.   Lancets (ONETOUCH DELICA PLUS [id number]) MISC USE 1  TO CHECK GLUCOSE ONCE DAILY    MICROLET LANCETS MISC Daily   Multiple Vitamins-Minerals (MULTIVITAMIN WITH MINERALS) tablet Take 1 tablet by mouth daily.   Omega-3 Fatty Acids (FISH OIL OMEGA-3 PO) Take by mouth.   ONETOUCH VERIO test strip USE 1 STRIP TO CHECK GLUCOSE AS NEEDED AS DIRECTED   sodium fluoride (FLUORISHIELD) 1.1 % GEL dental gel See admin instructions.   tadalafil (CIALIS) 20 MG tablet TAKE 1 TABLET BY MOUTH TWICE A WEEK   [DISCONTINUED] atorvastatin (LIPITOR) 40 MG tablet Take 1 tablet (40 mg total) by mouth daily.   [DISCONTINUED] lisinopril-hydrochlorothiazide (ZESTORETIC) 20-12.5 MG tablet Take 1 tablet by mouth once daily   [DISCONTINUED] metFORMIN (GLUCOPHAGE) 500 MG tablet TAKE 1 TABLET BY MOUTH TWICE DAILY WITH A MEAL   [DISCONTINUED] metoprolol tartrate (LOPRESSOR) 100 MG tablet Take 1 tablet by mouth twice daily   [DISCONTINUED] oxyCODONE (OXY IR/ROXICODONE) 5 MG immediate release tablet Take by mouth. (Patient not taking: Reported on 05/11/2022)   No facility-administered medications prior to visit.    Review of Systems  All other systems reviewed and are negative.         Objective:     BP 130/74   Pulse (!) 59   Temp (!) 97.5 F (36.4 C) (Oral)   Resp 14   Ht 5' 6.25" (1.683 m)  Wt 155 lb 12.8 oz (70.7 kg)   SpO2 96% Comment: room air  BMI 24.96 kg/m    Physical Exam  Alert and in no distress. Tympanic membranes and canals are normal. Pharyngeal area is normal. Neck is supple without adenopathy or thyromegaly. Cardiac exam shows a regular sinus rhythm without murmurs or gallops. Lungs are clear to auscultation.  Purpuric lesions noted on both forearms.  Hemoglobin A1c is 7.6  Results for orders placed or performed in visit on 12/13/22  POCT glycosylated hemoglobin (Hb A1C)  Result Value Ref Range   Hemoglobin A1C 7.6 (A) 4.0 - 5.6 %  POCT UA - Microalbumin  Result Value Ref Range   Microalbumin Ur, POC 48.8 mg/L   Creatinine, POC 57.2 mg/dL   Albumin/Creatinine Ratio,  Urine, POC 85.3        Assessment & Plan:    Routine general medical examination at a health care facility - Plan: CBC with Differential/Platelet, Comprehensive metabolic panel, Lipid panel  Hypertension associated with diabetes ([place]) - Plan: lisinopril-hydrochlorothiazide (ZESTORETIC) 20-12.5 MG tablet, metoprolol tartrate (LOPRESSOR) 100 MG tablet, CBC with Differential/Platelet, Comprehensive metabolic panel  Senile purpura (HCC)  Seasonal allergic rhinitis, unspecified trigger  Diabetic nephropathy associated with type 2 diabetes mellitus ([place])  Hyperlipidemia associated with type 2 diabetes mellitus ([place]) - Plan: atorvastatin (LIPITOR) 40 MG tablet, Lipid panel  Type 2 diabetes mellitus with other specified complication, without long-term current use of insulin ([place]) - Plan: metFORMIN (GLUCOPHAGE) 500 MG tablet, CBC with Differential/Platelet, Comprehensive metabolic panel, Lipid panel, POCT glycosylated hemoglobin (Hb A1C), POCT UA - Microalbumin  Benign prostatic hyperplasia with lower urinary tract symptoms, symptom details unspecified  Melanoma of scalp ([place])  Other male erectile dysfunction  Need for COVID-19 vaccine - Plan: Pfizer Fall 2023 Covid-19 Vaccine 35yr and older  Screening for colon cancer - Plan: Cologuard   Immunization History  Administered Date(s) Administered   COVID-19, mRNA, vaccine(Comirnaty)12 years and older 12/13/2022   DT (Pediatric) 11/07/2002   Fluad Quad(high Dose 65+) 08/14/2019, 08/14/2020, 09/15/2021, 09/17/2022   Influenza, High Dose Seasonal PF 12/02/2015, 07/22/2016, 07/27/2017, 12/12/2018   Influenza, Seasonal, Injecte, Preservative Fre 12/07/2012   PFIZER Comirnaty(Gray Top)Covid-19 Tri-Sucrose Vaccine 04/21/2021   PFIZER(Purple Top)SARS-COV-2 Vaccination 12/24/2019, 01/14/2020, 08/14/2020   Pfizer Covid-19 Vaccine Bivalent Booster 124yr& up 09/15/2021   Pneumococcal Conjugate-13 06/21/2013   Pneumococcal Polysaccharide-23  05/11/2006   Tdap 04/21/2012   Zoster Recombinat (Shingrix) 04/10/2018, 06/17/2018   Zoster, Live 03/14/2008    Health Maintenance  Topic Date Due   DTaP/Tdap/Td (3 - Td or Tdap) 04/21/2022   Diabetic kidney evaluation - eGFR measurement  09/15/2022   FOOT EXAM  09/15/2022   COVID-19 Vaccine (7 - 2023-24 season) 02/07/2023   HEMOGLOBIN A1C  06/13/2023   Medicare Annual Wellness (AWV)  09/18/2023   OPHTHALMOLOGY EXAM  10/01/2023   Diabetic kidney evaluation - Urine ACR  12/14/2023   Pneumonia Vaccine 6561Years old  Completed   INFLUENZA VACCINE  Completed   Zoster Vaccines- Shingrix  Completed   HPV VACCINES  Aged Out  He will continue on his present medication regimen.  Discussed using Cialis on a daily basis but he would like to continue on it on the every 3-day schedule.  He will follow-up with dermatology routinely.  Also recommend he get RSV at the drugstore.  Problem List Items Addressed This Visit     Allergic rhinitis, seasonal   BPH (benign prostatic hyperplasia)   Diabetic nephropathy associated with type 2 diabetes mellitus (  HCC)   Relevant Medications   lisinopril-hydrochlorothiazide (ZESTORETIC) 20-12.5 MG tablet   metFORMIN (GLUCOPHAGE) 500 MG tablet   atorvastatin (LIPITOR) 40 MG tablet   ED (erectile dysfunction)   Hyperlipidemia associated with type 2 diabetes mellitus (HCC)   Relevant Medications   lisinopril-hydrochlorothiazide (ZESTORETIC) 20-12.5 MG tablet   metFORMIN (GLUCOPHAGE) 500 MG tablet   metoprolol tartrate (LOPRESSOR) 100 MG tablet   atorvastatin (LIPITOR) 40 MG tablet   Other Relevant Orders   Lipid panel   Hypertension associated with diabetes ([place])   Relevant Medications   lisinopril-hydrochlorothiazide (ZESTORETIC) 20-12.5 MG tablet   metFORMIN (GLUCOPHAGE) 500 MG tablet   metoprolol tartrate (LOPRESSOR) 100 MG tablet   atorvastatin (LIPITOR) 40 MG tablet   Other Relevant Orders   CBC with Differential/Platelet   Comprehensive  metabolic panel   Melanoma of scalp (HCC)   Senile purpura (HCC)   Relevant Medications   lisinopril-hydrochlorothiazide (ZESTORETIC) 20-12.5 MG tablet   metoprolol tartrate (LOPRESSOR) 100 MG tablet   atorvastatin (LIPITOR) 40 MG tablet   Type 2 diabetes mellitus with other specified complication (HCC)   Relevant Medications   lisinopril-hydrochlorothiazide (ZESTORETIC) 20-12.5 MG tablet   metFORMIN (GLUCOPHAGE) 500 MG tablet   atorvastatin (LIPITOR) 40 MG tablet   Other Relevant Orders   CBC with Differential/Platelet   Comprehensive metabolic panel   Lipid panel   POCT glycosylated hemoglobin (Hb A1C) (Completed)   POCT UA - Microalbumin (Completed)   Other Visit Diagnoses     Routine general medical examination at a health care facility    -  Primary   Relevant Orders   CBC with Differential/Platelet   Comprehensive metabolic panel   Lipid panel   Need for COVID-19 vaccine       Relevant Orders   Pfizer Fall 2023 Covid-19 Vaccine 25yr and older (Completed)   Screening for colon cancer       Relevant Orders   Cologuard      Return in about 6 months (around 06/13/2023).     JJill Alexanders MD

## 2022-12-14 LAB — CBC WITH DIFFERENTIAL/PLATELET
Basophils Absolute: 0.1 10*3/uL (ref 0.0–0.2)
Basos: 1 %
EOS (ABSOLUTE): 0.2 10*3/uL (ref 0.0–0.4)
Eos: 2 %
Hematocrit: 40.9 % (ref 37.5–51.0)
Hemoglobin: 13.8 g/dL (ref 13.0–17.7)
Immature Grans (Abs): 0 10*3/uL (ref 0.0–0.1)
Immature Granulocytes: 0 %
Lymphocytes Absolute: 1.7 10*3/uL (ref 0.7–3.1)
Lymphs: 22 %
MCH: 32.5 pg (ref 26.6–33.0)
MCHC: 33.7 g/dL (ref 31.5–35.7)
MCV: 97 fL (ref 79–97)
Monocytes Absolute: 0.8 10*3/uL (ref 0.1–0.9)
Monocytes: 10 %
Neutrophils Absolute: 5 10*3/uL (ref 1.4–7.0)
Neutrophils: 65 %
Platelets: 241 10*3/uL (ref 150–450)
RBC: 4.24 x10E6/uL (ref 4.14–5.80)
RDW: 11.5 % — ABNORMAL LOW (ref 11.6–15.4)
WBC: 7.7 10*3/uL (ref 3.4–10.8)

## 2022-12-14 LAB — LIPID PANEL
Chol/HDL Ratio: 2.1 ratio (ref 0.0–5.0)
Cholesterol, Total: 111 mg/dL (ref 100–199)
HDL: 52 mg/dL (ref >39)
LDL Chol Calc (NIH): 45 mg/dL (ref 0–99)
Triglycerides: 68 mg/dL (ref 0–149)
VLDL Cholesterol Cal: 14 mg/dL (ref 5–40)

## 2022-12-14 LAB — COMPREHENSIVE METABOLIC PANEL
ALT: 29 IU/L (ref 0–44)
AST: 30 IU/L (ref 0–40)
Albumin/Globulin Ratio: 2.3 — ABNORMAL HIGH (ref 1.2–2.2)
Albumin: 4.6 g/dL (ref 3.7–4.7)
Alkaline Phosphatase: 79 IU/L (ref 44–121)
BUN/Creatinine Ratio: 20 (ref 10–24)
BUN: 17 mg/dL (ref 8–27)
Bilirubin Total: 0.8 mg/dL (ref 0.0–1.2)
CO2: 25 mmol/L (ref 20–29)
Calcium: 9.8 mg/dL (ref 8.6–10.2)
Chloride: 99 mmol/L (ref 96–106)
Creatinine, Ser: 0.87 mg/dL (ref 0.76–1.27)
Globulin, Total: 2 g/dL (ref 1.5–4.5)
Glucose: 126 mg/dL — ABNORMAL HIGH (ref 70–99)
Potassium: 4.3 mmol/L (ref 3.5–5.2)
Sodium: 139 mmol/L (ref 134–144)
Total Protein: 6.6 g/dL (ref 6.0–8.5)
eGFR: 86 mL/min/{1.73_m2} (ref >59)

## 2022-12-18 DIAGNOSIS — Z1211 Encounter for screening for malignant neoplasm of colon: Secondary | ICD-10-CM | POA: Diagnosis not present

## 2022-12-31 LAB — COLOGUARD: COLOGUARD: NEGATIVE

## 2023-02-24 ENCOUNTER — Other Ambulatory Visit: Payer: Self-pay | Admitting: Family Medicine

## 2023-02-24 DIAGNOSIS — E1169 Type 2 diabetes mellitus with other specified complication: Secondary | ICD-10-CM

## 2023-03-04 ENCOUNTER — Other Ambulatory Visit: Payer: Self-pay | Admitting: Family Medicine

## 2023-03-04 DIAGNOSIS — N401 Enlarged prostate with lower urinary tract symptoms: Secondary | ICD-10-CM

## 2023-03-04 DIAGNOSIS — N528 Other male erectile dysfunction: Secondary | ICD-10-CM

## 2023-03-08 DIAGNOSIS — C44622 Squamous cell carcinoma of skin of right upper limb, including shoulder: Secondary | ICD-10-CM | POA: Insufficient documentation

## 2023-03-08 DIAGNOSIS — L812 Freckles: Secondary | ICD-10-CM | POA: Diagnosis not present

## 2023-03-08 DIAGNOSIS — L821 Other seborrheic keratosis: Secondary | ICD-10-CM | POA: Diagnosis not present

## 2023-03-08 DIAGNOSIS — Z85828 Personal history of other malignant neoplasm of skin: Secondary | ICD-10-CM | POA: Diagnosis not present

## 2023-03-08 DIAGNOSIS — Z85820 Personal history of malignant melanoma of skin: Secondary | ICD-10-CM | POA: Diagnosis not present

## 2023-03-08 DIAGNOSIS — D485 Neoplasm of uncertain behavior of skin: Secondary | ICD-10-CM | POA: Diagnosis not present

## 2023-03-08 DIAGNOSIS — L720 Epidermal cyst: Secondary | ICD-10-CM | POA: Diagnosis not present

## 2023-03-08 DIAGNOSIS — L570 Actinic keratosis: Secondary | ICD-10-CM | POA: Diagnosis not present

## 2023-03-15 ENCOUNTER — Encounter: Payer: Self-pay | Admitting: Cardiovascular Disease

## 2023-03-15 ENCOUNTER — Ambulatory Visit: Payer: Medicare HMO | Attending: Cardiovascular Disease | Admitting: Cardiovascular Disease

## 2023-03-15 VITALS — BP 134/84 | HR 82 | Ht 68.0 in | Wt 158.2 lb

## 2023-03-15 DIAGNOSIS — I10 Essential (primary) hypertension: Secondary | ICD-10-CM | POA: Diagnosis not present

## 2023-03-15 DIAGNOSIS — I739 Peripheral vascular disease, unspecified: Secondary | ICD-10-CM

## 2023-03-15 DIAGNOSIS — E785 Hyperlipidemia, unspecified: Secondary | ICD-10-CM

## 2023-03-15 NOTE — Progress Notes (Signed)
Cardiology Office Note   Date:  03/15/2023   ID:  JACOBS GOLAB, DOB [birth year], MRN [id number]  PCP:  Ronnald Nian, MD  Cardiologist:   Lorine Bears, MD   No chief complaint on file.     History of Present Illness: Maxwell Sanders is a 82 y.o. male who is here today for follow-up visit regarding peripheral arterial disease.   He has known history of type 2 diabetes with peripheral neuropathy, hyperlipidemia, essential hypertension, previous remote tobacco use, BPH, erectile dysfunction and peptic ulcer disease. He has known moderately reduced ABI on the left side with occluded popliteal artery.  He is being treated medically. He has been doing well with no recent chest pain, shortness of breath or palpitations.   I switch him to atorvastatin last year and has been tolerating the medication very well.  He reports stable mild left calf claudication with no rest pain or lower extremity ulceration.   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 Laural Benes   shave Left    malignant melanoma desmoplastic   Shave Left 05/05/2021   well differentiated squamous cell carinoma, base involved   SKIN BIOPSY Right 02/08/2020   well differentiated squamous cell carcinoma   SKIN BIOPSY Right 05/12/2022   well diff squmaous cell carcinoma , based involved     Current Outpatient Medications  Medication Sig Dispense Refill   aspirin 81 MG chewable tablet Chew by mouth daily.     atorvastatin (LIPITOR) 40 MG tablet Take 1  tablet (40 mg total) by mouth daily. 90 tablet 3   Cholecalciferol (VITAMIN D3) 3000 units TABS Take by mouth.     Glucosamine-Chondroit-Vit C-Mn (GLUCOSAMINE 1500 COMPLEX PO) Take by mouth.     Lancets (ONETOUCH DELICA PLUS LANCET33G) MISC USE 1  TO CHECK GLUCOSE ONCE DAILY 100 each 0   lisinopril-hydrochlorothiazide (ZESTORETIC) 20-12.5 MG tablet Take 1 tablet by mouth daily. 90 tablet 0   metFORMIN (GLUCOPHAGE) 500 MG tablet TAKE 1 TABLET BY MOUTH TWICE DAILY WITH A MEAL 180 tablet 1   metoprolol tartrate (LOPRESSOR) 100 MG tablet Take 1 tablet (100 mg total) by mouth 2 (two) times daily. 180 tablet 0   MICROLET LANCETS MISC Daily 100 each PRN   Multiple Vitamins-Minerals (MULTIVITAMIN WITH MINERALS) tablet Take 1 tablet by mouth daily.     Omega-3 Fatty Acids (FISH OIL OMEGA-3 PO) Take by mouth.     ONETOUCH VERIO test strip USE 1 STRIP TO CHECK GLUCOSE AS NEEDED AS DIRECTED 100 each 0   sodium fluoride (FLUORISHIELD) 1.1 % GEL dental gel See admin instructions.     tadalafil (CIALIS) 20 MG tablet TAKE 1 TABLET BY MOUTH TWICE A WEEK 90 tablet 0   No current facility-administered medications for this visit.    Allergies:   Penicillins    Social History:  The patient  reports that he has quit smoking. He  has never used smokeless tobacco. He reports that he does not drink alcohol and does not use drugs.   Family History:  The patient's family history is negative for coronary artery disease.   ROS:  Please see the history of present illness.   Otherwise, review of systems are positive for none.   All other systems are reviewed and negative.    PHYSICAL EXAM: VS:  BP 134/84   Pulse 82   Ht  (1.727 m)   Wt 158 lb 3.2 oz (71.8 kg)   SpO2 95%   BMI 24.05 kg/m  , BMI Body mass index is 24.05 kg/m. GEN: Well nourished, well developed, in no acute distress  HEENT: normal  Neck: no JVD, carotid bruits, or masses Cardiac: RRR; no murmurs, rubs, or gallops,no edema  Respiratory:   clear to auscultation bilaterally, normal work of breathing GI: soft, nontender, nondistended, + BS MS: no deformity or atrophy  Skin: warm and dry, no rash Neuro:  Strength and sensation are intact Psych: euthymic mood, full affect Vascular: Pedal pulses are palpable on the right side but not the left side.  No lower extremity ulceration.   EKG:  EKG is ordered today. The ekg ordered today demonstrates sinus rhythm with first-degree AV block, left axis deviation and poor R wave progression in the anterior leads with chronic J-point elevation.   Recent Labs: 12/13/2022: ALT 29; BUN 17; Creatinine, Ser 0.87; Hemoglobin 13.8; Platelets 241; Potassium 4.3; Sodium 139    Lipid Panel    Component Value Date/Time   CHOL 111 12/13/2022 1202   TRIG 68 12/13/2022 1202   HDL 52 12/13/2022 1202   CHOLHDL 2.1 12/13/2022 1202   CHOLHDL 2.6 07/27/2017 1003   VLDL 15 07/22/2016 1013   LDLCALC 45 12/13/2022 1202   LDLCALC 70 07/27/2017 1003      Wt Readings from Last 3 Encounters:  03/15/23 158 lb 3.2 oz (71.8 kg)  12/13/22 155 lb 12.8 oz (70.7 kg)  09/17/22 155 lb 12.8 oz (70.7 kg)           No data to display            ASSESSMENT AND PLAN:  1.  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  He has mild left calf claudication that is not lifestyle limiting at the present time.  Recommend continuing medical therapy.    2.  Essential hypertension: Blood pressure is well controlled on current medications.  3.  Hyperlipidemia: She is doing well with atorvastatin and his lipid profile improved significantly with an LDL of 45.  4.  Abnormal EKG with J-point elevation in the anterolateral leads.  He has no symptoms suggestive of cardiac disease.    Disposition:   FU with me in 1 year  Signed,  Lorine Bears, MD  03/15/2023 10:08 AM    [facility]

## 2023-03-15 NOTE — Patient Instructions (Signed)
Medication Instructions:  No changes *If you need a refill on your cardiac medications before your next appointment, please call your pharmacy*   Lab Work: None ordered If you have labs (blood work) drawn today and your tests are completely normal, you will receive your results only by: MyChart Message (if you have MyChart) OR A paper copy in the mail If you have any lab test that is abnormal or we need to change your treatment, we will call you to review the results.   Testing/Procedures: None ordered   Follow-Up: At [facility], you and your health needs are our priority.  As part of our continuing mission to provide you with exceptional heart care, we have created designated Provider Care Teams.  These Care Teams include your primary Cardiologist (physician) and Advanced Practice Providers (APPs -  Physician Assistants and Nurse Practitioners) who all work together to provide you with the care you need, when you need it.  We recommend signing up for the patient portal called "MyChart".  Sign up information is provided on this After Visit Summary.  MyChart is used to connect with patients for Virtual Visits (Telemedicine).  Patients are able to view lab/test results, encounter notes, upcoming appointments, etc.  Non-urgent messages can be sent to your provider as well.   To learn more about what you can do with MyChart, go to [email or web address].    Your next appointment:   12 month(s)  Provider:   Dr. Arida  

## 2023-03-29 ENCOUNTER — Other Ambulatory Visit: Payer: Self-pay | Admitting: Family Medicine

## 2023-03-29 DIAGNOSIS — E1169 Type 2 diabetes mellitus with other specified complication: Secondary | ICD-10-CM

## 2023-05-02 DIAGNOSIS — C434 Malignant melanoma of scalp and neck: Secondary | ICD-10-CM | POA: Diagnosis not present

## 2023-05-03 ENCOUNTER — Other Ambulatory Visit: Payer: Self-pay | Admitting: Family Medicine

## 2023-05-03 DIAGNOSIS — I152 Hypertension secondary to endocrine disorders: Secondary | ICD-10-CM

## 2023-06-14 ENCOUNTER — Encounter: Payer: Self-pay | Admitting: Family Medicine

## 2023-06-14 ENCOUNTER — Ambulatory Visit: Payer: Medicare HMO | Admitting: Family Medicine

## 2023-06-14 VITALS — BP 142/52 | HR 60 | Temp 97.8°F | Resp 14 | Wt 160.0 lb

## 2023-06-14 DIAGNOSIS — E1159 Type 2 diabetes mellitus with other circulatory complications: Secondary | ICD-10-CM

## 2023-06-14 DIAGNOSIS — E1121 Type 2 diabetes mellitus with diabetic nephropathy: Secondary | ICD-10-CM

## 2023-06-14 DIAGNOSIS — J302 Other seasonal allergic rhinitis: Secondary | ICD-10-CM | POA: Diagnosis not present

## 2023-06-14 DIAGNOSIS — N529 Male erectile dysfunction, unspecified: Secondary | ICD-10-CM | POA: Diagnosis not present

## 2023-06-14 DIAGNOSIS — C434 Malignant melanoma of scalp and neck: Secondary | ICD-10-CM | POA: Diagnosis not present

## 2023-06-14 DIAGNOSIS — E785 Hyperlipidemia, unspecified: Secondary | ICD-10-CM

## 2023-06-14 DIAGNOSIS — E1169 Type 2 diabetes mellitus with other specified complication: Secondary | ICD-10-CM | POA: Diagnosis not present

## 2023-06-14 DIAGNOSIS — I152 Hypertension secondary to endocrine disorders: Secondary | ICD-10-CM | POA: Diagnosis not present

## 2023-06-14 DIAGNOSIS — N401 Enlarged prostate with lower urinary tract symptoms: Secondary | ICD-10-CM | POA: Diagnosis not present

## 2023-06-14 DIAGNOSIS — R972 Elevated prostate specific antigen [PSA]: Secondary | ICD-10-CM

## 2023-06-14 LAB — POCT GLYCOSYLATED HEMOGLOBIN (HGB A1C): Hemoglobin A1C: 7.5 % — AB (ref 4.0–5.6)

## 2023-06-14 NOTE — Progress Notes (Signed)
Subjective:    Patient ID: Maxwell Sanders, male    DOB: 1941-05-02, 82 y.o.   MRN: [id number]  Maxwell Sanders is a 82 y.o. male who presents for follow-up of Type 2 diabetes mellitus.  Home blood sugar records:  home blood sugar readings varies Current symptoms/problems include none and have been stable. Daily foot checks: yes   Any foot concerns: no How often blood sugars checked: Exercise: The patient does not participate in regular exercise at present. Diet: regular He is seen regularly at [facility] for his melanoma and also locally for his various other kinds of skin cancers.  His allergies are under good control.  He does use Cialis but mainly for BPH.  He states that he takes the medication every 4 days which helps with his bladder.  He continues on lisinopril and metoprolol to have no difficulties.  He does take metformin and seems to be doing well on it.  His immunizations were reviewed. The following portions of the patient's history were reviewed and updated as appropriate: allergies, current medications, past medical history, past social history and problem list.  ROS as in subjective above.     Objective:    Physical Exam Alert and in no distress otherwise not examined. Hemoglobin A1c is 7.5 Blood pressure (!) 142/52, pulse 60, temperature 97.8 F (36.6 C), temperature source Oral, resp. rate 14, weight 160 lb (72.6 kg), SpO2 97%.  Lab Review    Latest Ref Rng & Units 06/14/2023   10:37 AM 12/13/2022   12:02 PM 12/13/2022   11:26 AM 12/13/2022   11:24 AM 05/11/2022   12:18 PM  Diabetic Labs  HbA1c 4.0 - 5.6 % 7.5   7.6     Microalbumin mg/L    48.8  41.2   Micro/Creat Ratio     85.3  61.7   Chol 100 - 199 mg/dL  409      HDL >81 mg/dL  52      Calc LDL 0 - 99 mg/dL  45      Triglycerides 0 - 149 mg/dL  68      Creatinine 1.91 - 1.27 mg/dL  4.78          2/95/6213   10:32 AM 06/14/2023   10:24 AM 03/15/2023    9:45 AM 12/13/2022   11:04 AM 09/17/2022    9:49 AM   BP/Weight  Systolic BP 142 160 134 130 102  Diastolic BP 52 62 84 74 60  Wt. (Lbs)  160 158.2 155.8 155.8  BMI  24.33 kg/m2 24.05 kg/m2 24.96 kg/m2 24.4 kg/m2      Latest Ref Rng & Units 09/30/2022   12:00 AM 09/23/2021   12:00 AM  Foot/eye exam completion dates  Eye Exam No Retinopathy No Retinopathy  No Retinopathy         This result is from an external source.    Maxwell Sanders  reports that he has quit smoking. He has never used smokeless tobacco. He reports that he does not drink alcohol and does not use drugs.     Assessment & Plan:    Type 2 diabetes mellitus with other specified complication, without long-term current use of insulin (HCC) - Plan: POCT glycosylated hemoglobin (Hb A1C)  Seasonal allergic rhinitis, unspecified trigger  Benign prostatic hyperplasia with lower urinary tract symptoms, symptom details unspecified  Diabetic nephropathy associated with type 2 diabetes mellitus (HCC)  Elevated PSA  Erectile dysfunction, unspecified erectile dysfunction type  Hyperlipidemia associated  with type 2 diabetes mellitus (HCC)  Hypertension associated with diabetes (HCC)  Melanoma of scalp (HCC) He is comfortable with his present medication regimen and is not interested in adjusting any of his medications or adding any new meds for his diabetes.  Did recommend he get Tdap as well as flu and COVID shots this fall.

## 2023-06-30 ENCOUNTER — Other Ambulatory Visit: Payer: Self-pay | Admitting: Family Medicine

## 2023-06-30 MED ORDER — ONETOUCH VERIO VI STRP: ORAL_STRIP | 1 refills | Status: DC

## 2023-07-01 ENCOUNTER — Other Ambulatory Visit: Payer: Self-pay | Admitting: Family Medicine

## 2023-07-01 DIAGNOSIS — E1159 Type 2 diabetes mellitus with other circulatory complications: Secondary | ICD-10-CM

## 2023-07-04 ENCOUNTER — Telehealth: Payer: Self-pay | Admitting: Family Medicine

## 2023-07-04 ENCOUNTER — Other Ambulatory Visit: Payer: Self-pay

## 2023-07-04 MED ORDER — ONETOUCH VERIO VI STRP: ORAL_STRIP | 1 refills | Status: DC

## 2023-07-04 NOTE — Telephone Encounter (Signed)
[facility] left voicemail  They received rx one touch test strips  but they need directions and frequency

## 2023-07-07 DIAGNOSIS — D485 Neoplasm of uncertain behavior of skin: Secondary | ICD-10-CM | POA: Diagnosis not present

## 2023-07-07 DIAGNOSIS — D692 Other nonthrombocytopenic purpura: Secondary | ICD-10-CM | POA: Diagnosis not present

## 2023-07-07 DIAGNOSIS — D1801 Hemangioma of skin and subcutaneous tissue: Secondary | ICD-10-CM | POA: Diagnosis not present

## 2023-07-07 DIAGNOSIS — L858 Other specified epidermal thickening: Secondary | ICD-10-CM | POA: Diagnosis not present

## 2023-07-07 DIAGNOSIS — D0461 Carcinoma in situ of skin of right upper limb, including shoulder: Secondary | ICD-10-CM | POA: Diagnosis not present

## 2023-07-07 DIAGNOSIS — L812 Freckles: Secondary | ICD-10-CM | POA: Diagnosis not present

## 2023-07-07 DIAGNOSIS — L570 Actinic keratosis: Secondary | ICD-10-CM | POA: Diagnosis not present

## 2023-07-07 DIAGNOSIS — Z85820 Personal history of malignant melanoma of skin: Secondary | ICD-10-CM | POA: Diagnosis not present

## 2023-07-07 DIAGNOSIS — L821 Other seborrheic keratosis: Secondary | ICD-10-CM | POA: Diagnosis not present

## 2023-07-07 DIAGNOSIS — C4492 Squamous cell carcinoma of skin, unspecified: Secondary | ICD-10-CM | POA: Insufficient documentation

## 2023-07-07 DIAGNOSIS — Z85828 Personal history of other malignant neoplasm of skin: Secondary | ICD-10-CM | POA: Diagnosis not present

## 2023-07-26 ENCOUNTER — Other Ambulatory Visit: Payer: Self-pay | Admitting: Family Medicine

## 2023-07-26 DIAGNOSIS — E1159 Type 2 diabetes mellitus with other circulatory complications: Secondary | ICD-10-CM

## 2023-09-06 ENCOUNTER — Ambulatory Visit (INDEPENDENT_AMBULATORY_CARE_PROVIDER_SITE_OTHER): Payer: Medicare HMO

## 2023-09-06 VITALS — BP 130/62 | HR 60 | Temp 97.7°F | Ht 67.5 in | Wt 156.2 lb

## 2023-09-06 DIAGNOSIS — Z0000 Encounter for general adult medical examination without abnormal findings: Secondary | ICD-10-CM

## 2023-09-06 DIAGNOSIS — Z23 Encounter for immunization: Secondary | ICD-10-CM

## 2023-09-06 NOTE — Patient Instructions (Signed)
Mr. Cormier , Thank you for taking time to come for your Medicare Wellness Visit. I appreciate your ongoing commitment to your health goals. Please review the following plan we discussed and let me know if I can assist you in the future.   Referrals/Orders/Follow-Ups/Clinician Recommendations: none  This is a list of the screening recommended for you and due dates:  Health Maintenance  Topic Date Due   Pneumonia Vaccine (2 of 2 - PCV) 06/21/2013   DTaP/Tdap/Td vaccine (3 - Td or Tdap) 04/21/2022   Complete foot exam   09/15/2022   COVID-19 Vaccine (7 - 2023-24 season) 07/24/2023   Eye exam for diabetics  10/01/2023   Yearly kidney function blood test for diabetes  12/14/2023   Yearly kidney health urinalysis for diabetes  12/14/2023   Hemoglobin A1C  12/15/2023   Medicare Annual Wellness Visit  09/05/2024   Flu Shot  Completed   Zoster (Shingles) Vaccine  Completed   HPV Vaccine  Aged Out    Advanced directives: (Copy Requested) Please bring a copy of your health care power of attorney and living will to the office to be added to your chart at your convenience.  Next Medicare Annual Wellness Visit scheduled for next year: Yes  insert Preventive Care attachment Insert FALL PREVENTION attachment if needed

## 2023-09-06 NOTE — Progress Notes (Signed)
Subjective:   Maxwell Sanders is a 82 y.o. male who presents for Medicare Annual/Subsequent preventive examination.  Visit Complete: In person  Patient Medicare AWV questionnaire was completed by patient on 09/06/2023; I have confirmed that all information answered by patient is correct and no changes since this date.  Cardiac Risk Factors include: advanced age (>57men, >52 women);diabetes mellitus;dyslipidemia;hypertension;male gender     Objective:    Today's Vitals   09/06/23 1315  BP: 130/62  Pulse: 60  Temp: 97.7 F (36.5 C)  TempSrc: Oral  SpO2: 97%  Weight: 156 lb 3.2 oz (70.9 kg)  Height: 5' 7.5" (1.715 m)   Body mass index is 24.1 kg/m.     09/06/2023    1:20 PM 09/17/2022    9:59 AM 09/15/2021   10:26 AM 08/14/2020   10:14 AM 08/02/2018   10:11 AM 07/22/2016    9:35 AM 07/08/2014   10:51 AM  Advanced Directives  Does Patient Have a Medical Advance Directive? Yes Yes Yes Yes Yes Yes Yes  Type of Estate agent of [place];Living will Healthcare Power of [place];Living will Living will;Healthcare Power of Attorney Living will Healthcare Power of [place];Living will Healthcare Power of [place];Living will Living will  Does patient want to make changes to medical advance directive?     No - Patient declined    Copy of Healthcare Power of Attorney in Chart? No - copy requested No - copy requested No - copy requested  No - copy requested No - copy requested No - copy requested    Current Medications (verified) Outpatient Encounter Medications as of 09/06/2023  Medication Sig   aspirin 81 MG chewable tablet Chew by mouth daily.   atorvastatin (LIPITOR) 40 MG tablet Take 1 tablet (40 mg total) by mouth daily.   Cholecalciferol (VITAMIN D3) 3000 units TABS Take by mouth.   Glucosamine-Chondroit-Vit C-Mn (GLUCOSAMINE 1500 COMPLEX PO) Take by mouth.   glucose blood (ONETOUCH VERIO) test strip Twice daily   Lancets (ONETOUCH DELICA PLUS  LANCET33G) MISC USE 1 LANCET TO CHECK GLUCOSE ONCE DAILY   lisinopril-hydrochlorothiazide (ZESTORETIC) 20-12.5 MG tablet Take 1 tablet by mouth once daily   metFORMIN (GLUCOPHAGE) 500 MG tablet TAKE 1 TABLET BY MOUTH TWICE DAILY WITH A MEAL   metoprolol tartrate (LOPRESSOR) 100 MG tablet Take 1 tablet by mouth twice daily   Multiple Vitamins-Minerals (MULTIVITAMIN WITH MINERALS) tablet Take 1 tablet by mouth daily.   Omega-3 Fatty Acids (FISH OIL OMEGA-3 PO) Take by mouth.   sodium fluoride (FLUORISHIELD) 1.1 % GEL dental gel See admin instructions.   tadalafil (CIALIS) 20 MG tablet TAKE 1 TABLET BY MOUTH TWICE A WEEK   MICROLET LANCETS MISC Daily   No facility-administered encounter medications on file as of 09/06/2023.    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 Laural Benes   shave Left    malignant  melanoma desmoplastic   Shave Left 05/05/2021   well differentiated squamous cell carinoma, base involved   SKIN BIOPSY Right 02/08/2020   well differentiated squamous cell carcinoma   SKIN BIOPSY Right 05/12/2022   well diff squmaous cell carcinoma , based involved   History reviewed. No pertinent family history. Social History   Socioeconomic History   Marital status: Married    Spouse name: Not on file   Number of children: Not on file   Years of education: Not on file   Highest education level: Not on file  Occupational History   Not on file  Tobacco Use   Smoking  status: Former   Smokeless tobacco: Never  Vaping Use   Vaping status: Never Used  Substance and Sexual Activity   Alcohol use: No   Drug use: No   Sexual activity: Not Currently  Other Topics Concern   Not on file  Social History Narrative   Not on file   Social Determinants of Health   Financial Resource Strain: Low Risk  (09/06/2023)   Overall Financial Resource Strain (CARDIA)    Difficulty of Paying Living Expenses: Not hard at all  Food Insecurity: No Food Insecurity (09/06/2023)   Hunger Vital Sign    Worried About Running Out of Food in the Last Year: Never true    Ran Out of Food in the Last Year: Never true  Transportation Needs: No Transportation Needs (09/06/2023)   PRAPARE - Administrator, Civil Service (Medical): No    Lack of Transportation (Non-Medical): No  Physical Activity: Inactive (09/06/2023)   Exercise Vital Sign    Days of Exercise per Week: 0 days    Minutes of Exercise per Session: 0 min  Stress: No Stress Concern Present (09/06/2023)   [employer] of Occupational Health - Occupational Stress Questionnaire    Feeling of Stress : Not at all  Social Connections: Unknown (09/06/2023)   Social Connection and Isolation Panel [NHANES]    Frequency of Communication with Friends and Family: Once a week    Frequency of Social Gatherings with Friends and Family: Never    Attends Religious Services: Not on Marketing executive or Organizations: No    Attends Banker Meetings: Never    Marital Status: Married    Tobacco Counseling Counseling given: Not Answered   Clinical Intake:  Pre-visit preparation completed: Yes  Pain : No/denies pain     Nutritional Risks: None Diabetes: Yes CBG done?: No Did pt. bring in CBG monitor from home?: No  How often do you need to have someone help you when you read instructions, pamphlets, or other written materials from your doctor or pharmacy?: 1 -  Never  Interpreter Needed?: No  Information entered by :: NAllen LPN   Activities of Daily Living    09/06/2023   11:30 AM 09/17/2022   10:02 AM  In your present state of health, do you have any difficulty performing the following activities:  Hearing? 0 0  Vision? 0 0  Difficulty concentrating or making decisions? 0 1  Walking or climbing stairs? 0 0  Dressing or bathing? 0 0  Doing errands, shopping? 0 0  Preparing Food and eating ? N N  Using the Toilet? N N  In the past six months, have you accidently leaked urine? N N  Do you have problems with loss of bowel control? N N  Managing your Medications? N N  Managing your  Finances? N N  Housekeeping or managing your Housekeeping? N N    Patient Care Team: Ronnald Nian, MD as PCP - General (Family Medicine)  Indicate any recent Medical Services you may have received from other than [facility] providers in the past year (date may be approximate).     Assessment:   This is a routine wellness examination for Shelly.  Hearing/Vision screen Hearing Screening - Comments:: Denies hearing issues Vision Screening - Comments:: Regular eye exams, [facility]   Goals Addressed             This Visit's Progress    Patient Stated       09/06/2023, stay alive       Depression Screen    09/06/2023    1:21 PM 09/17/2022   10:00 AM 09/15/2021   10:28 AM 08/14/2020   10:08 AM 08/14/2019   10:34 AM 08/02/2018    9:37 AM 07/27/2017    9:41 AM  PHQ 2/9 Scores  PHQ - 2 Score 0 0 0 0 0 0 0  PHQ- 9 Score 5 0         Fall Risk    09/06/2023   11:30 AM 12/13/2022   11:11 AM 09/17/2022   10:00 AM 09/15/2021   10:27 AM 08/14/2020   10:08 AM  Fall Risk   Falls in the past year? 0 0 0 0 0  Number falls in past yr: 0 0 0 0   Injury with Fall? 0 0 0 0   Risk for fall due to : Medication side effect No Fall Risks Medication side effect History of fall(s)   Follow up Falls prevention discussed;Falls evaluation completed Falls  evaluation completed Falls prevention discussed;Falls evaluation completed;Education provided Falls evaluation completed     MEDICARE RISK AT HOME: Medicare Risk at Home Any stairs in or around the home?: Yes If so, are there any without handrails?: No Home free of loose throw rugs in walkways, pet beds, electrical cords, etc?: Yes Adequate lighting in your home to reduce risk of falls?: Yes Life alert?: No Use of a cane, walker or w/c?: No Grab bars in the bathroom?: Yes Shower chair or bench in shower?: No Elevated toilet seat or a handicapped toilet?: No  TIMED UP AND GO:  Was the test performed?  Yes  Length of time to ambulate 10 feet: 5 sec Gait steady and fast without use of assistive device    Cognitive Function:        09/06/2023    1:24 PM 09/17/2022   10:08 AM  6CIT Screen  What Year? 0 points 0 points  What month? 0 points 0 points  What time? 0 points 0 points  Count back from 20 0 points 0 points  Months in reverse 0 points 0 points  Repeat phrase 0 points 4 points  Total Score 0 points 4 points    Immunizations Immunization History  Administered Date(s) Administered   DT (Pediatric) 11/07/2002   Fluad Quad(high Dose 65+) 08/14/2019, 08/14/2020, 09/15/2021, 09/17/2022   Fluad Trivalent(High Dose 65+) 09/06/2023   Influenza, High Dose Seasonal PF 12/02/2015, 07/22/2016, 07/27/2017, 12/12/2018   Influenza, Seasonal, Injecte, Preservative Fre 12/07/2012   PFIZER Comirnaty(Gray Top)Covid-19 Tri-Sucrose Vaccine 04/21/2021   PFIZER(Purple Top)SARS-COV-2 Vaccination 12/24/2019, 01/14/2020, 08/14/2020   Pfizer Covid-19 Vaccine Bivalent Booster 43yrs & up 09/15/2021   Pfizer(Comirnaty)Fall Seasonal Vaccine 12 years and older 12/13/2022   Pneumococcal Conjugate-13 06/21/2013   Pneumococcal Polysaccharide-23 05/11/2006   Respiratory Syncytial Virus  Vaccine,Recomb Aduvanted(Arexvy) 01/03/2023   Tdap 04/21/2012   Zoster Recombinant(Shingrix) 04/10/2018,  06/17/2018   Zoster, Live 03/14/2008    TDAP status: Due, Education has been provided regarding the importance of this vaccine. Advised may receive this vaccine at local pharmacy or Health Dept. Aware to provide a copy of the vaccination record if obtained from local pharmacy or Health Dept. Verbalized acceptance and understanding.  Flu Vaccine status: Completed at today's visit  Pneumococcal vaccine status: Up to date  Covid-19 vaccine status: Information provided on how to obtain vaccines.   Qualifies for Shingles Vaccine? Yes   Zostavax completed Yes   Shingrix Completed?: Yes  Screening Tests Health Maintenance  Topic Date Due   Pneumonia Vaccine 44+ Years old (2 of 2 - PCV) 06/21/2013   DTaP/Tdap/Td (3 - Td or Tdap) 04/21/2022   FOOT EXAM  09/15/2022   COVID-19 Vaccine (7 - 2023-24 season) 07/24/2023   OPHTHALMOLOGY EXAM  10/01/2023   Diabetic kidney evaluation - eGFR measurement  12/14/2023   Diabetic kidney evaluation - Urine ACR  12/14/2023   HEMOGLOBIN A1C  12/15/2023   Medicare Annual Wellness (AWV)  09/05/2024   INFLUENZA VACCINE  Completed   Zoster Vaccines- Shingrix  Completed   HPV VACCINES  Aged Out    Health Maintenance  Health Maintenance Due  Topic Date Due   Pneumonia Vaccine 55+ Years old (2 of 2 - PCV) 06/21/2013   DTaP/Tdap/Td (3 - Td or Tdap) 04/21/2022   FOOT EXAM  09/15/2022   COVID-19 Vaccine (7 - 2023-24 season) 07/24/2023    Colorectal cancer screening: No longer required.   Lung Cancer Screening: (Low Dose CT Chest recommended if Age 95-80 years, 20 pack-year currently smoking OR have quit w/in 15years.) does not qualify.   Lung Cancer Screening Referral: no  Additional Screening:  Hepatitis C Screening: does not qualify;   Vision Screening: Recommended annual ophthalmology exams for early detection of glaucoma and other disorders of the eye. Is the patient up to date with their annual eye exam?  No  Who is the provider or what is  the name of the office in which the patient attends annual eye exams? [facility] If pt is not established with a provider, would they like to be referred to a provider to establish care? No .   Dental Screening: Recommended annual dental exams for proper oral hygiene  Diabetic Foot Exam: Diabetic Foot Exam: Overdue, Pt has been advised about the importance in completing this exam. Pt is scheduled for diabetic foot exam on next appointment.  Community Resource Referral / Chronic Care Management: CRR required this visit?  No   CCM required this visit?  No     Plan:     I have personally reviewed and noted the following in the patient's chart:   Medical and social history Use of alcohol, tobacco or illicit drugs  Current medications and supplements including opioid prescriptions. Patient is not currently taking opioid prescriptions. Functional ability and status Nutritional status Physical activity Advanced directives List of other physicians Hospitalizations, surgeries, and ER visits in previous 12 months Vitals Screenings to include cognitive, depression, and falls Referrals and appointments  In addition, I have reviewed and discussed with patient certain preventive protocols, quality metrics, and best practice recommendations. A written personalized care plan for preventive services as well as general preventive health recommendations were provided to patient.     Barb Merino, LPN   10/62/6948   After Visit Summary: (In Person-Printed) AVS  printed and given to the patient  Nurse Notes: none

## 2023-10-01 ENCOUNTER — Other Ambulatory Visit: Payer: Self-pay | Admitting: Family Medicine

## 2023-10-01 DIAGNOSIS — I152 Hypertension secondary to endocrine disorders: Secondary | ICD-10-CM

## 2023-10-05 DIAGNOSIS — H04521 Eversion of right lacrimal punctum: Secondary | ICD-10-CM | POA: Diagnosis not present

## 2023-10-05 DIAGNOSIS — D3131 Benign neoplasm of right choroid: Secondary | ICD-10-CM | POA: Diagnosis not present

## 2023-10-05 DIAGNOSIS — E119 Type 2 diabetes mellitus without complications: Secondary | ICD-10-CM | POA: Diagnosis not present

## 2023-10-05 DIAGNOSIS — H43812 Vitreous degeneration, left eye: Secondary | ICD-10-CM | POA: Diagnosis not present

## 2023-10-05 DIAGNOSIS — H02132 Senile ectropion of right lower eyelid: Secondary | ICD-10-CM | POA: Diagnosis not present

## 2023-10-05 DIAGNOSIS — Z961 Presence of intraocular lens: Secondary | ICD-10-CM | POA: Diagnosis not present

## 2023-10-05 DIAGNOSIS — H04123 Dry eye syndrome of bilateral lacrimal glands: Secondary | ICD-10-CM | POA: Diagnosis not present

## 2023-10-05 DIAGNOSIS — H02135 Senile ectropion of left lower eyelid: Secondary | ICD-10-CM | POA: Diagnosis not present

## 2023-10-05 LAB — HM DIABETES EYE EXAM

## 2023-10-06 ENCOUNTER — Other Ambulatory Visit: Payer: Self-pay | Admitting: Family Medicine

## 2023-10-06 DIAGNOSIS — I152 Hypertension secondary to endocrine disorders: Secondary | ICD-10-CM

## 2023-10-12 ENCOUNTER — Ambulatory Visit (INDEPENDENT_AMBULATORY_CARE_PROVIDER_SITE_OTHER): Payer: Medicare HMO | Admitting: Family Medicine

## 2023-10-12 ENCOUNTER — Encounter: Payer: Self-pay | Admitting: Family Medicine

## 2023-10-12 VITALS — BP 130/70 | HR 61 | Ht 68.0 in | Wt 156.8 lb

## 2023-10-12 DIAGNOSIS — N400 Enlarged prostate without lower urinary tract symptoms: Secondary | ICD-10-CM

## 2023-10-12 DIAGNOSIS — E1121 Type 2 diabetes mellitus with diabetic nephropathy: Secondary | ICD-10-CM

## 2023-10-12 DIAGNOSIS — E1159 Type 2 diabetes mellitus with other circulatory complications: Secondary | ICD-10-CM | POA: Diagnosis not present

## 2023-10-12 DIAGNOSIS — D692 Other nonthrombocytopenic purpura: Secondary | ICD-10-CM

## 2023-10-12 DIAGNOSIS — E1169 Type 2 diabetes mellitus with other specified complication: Secondary | ICD-10-CM | POA: Diagnosis not present

## 2023-10-12 DIAGNOSIS — Z23 Encounter for immunization: Secondary | ICD-10-CM | POA: Diagnosis not present

## 2023-10-12 DIAGNOSIS — I739 Peripheral vascular disease, unspecified: Secondary | ICD-10-CM

## 2023-10-12 DIAGNOSIS — C434 Malignant melanoma of scalp and neck: Secondary | ICD-10-CM

## 2023-10-12 DIAGNOSIS — E785 Hyperlipidemia, unspecified: Secondary | ICD-10-CM

## 2023-10-12 DIAGNOSIS — I152 Hypertension secondary to endocrine disorders: Secondary | ICD-10-CM

## 2023-10-12 LAB — POCT GLYCOSYLATED HEMOGLOBIN (HGB A1C): Hemoglobin A1C: 7 % — AB (ref 4.0–5.6)

## 2023-10-12 NOTE — Progress Notes (Signed)
  Subjective:    Patient ID: Maxwell Sanders, male    DOB: 08/09/1941, 82 y.o.   MRN: [id number]  Maxwell Sanders is a 82 y.o. male who presents for follow-up of Type 2 diabetes mellitus.  Home blood sugar records: fasting range: A1C at home 6.6-7.8 Current symptoms/problems include none and have been stable. Daily foot checks:yes   Any foot concerns: none How often blood sugars checked: Exercise: The patient does not participate in regular exercise at present. Diet: regular diet, does not eat desserts or candy.  He recently saw the ophthalmologist. He continues on lisinopril/HCTZ as well as metoprolol.  He uses Cialis mainly for urinary symptoms.  Continues on metformin without difficulty as well as Lipitor.  He follows up regularly with dermatology for his underlying melanoma.  He is scheduled for follow-up concerning the PAD. The following portions of the patient's history were reviewed and updated as appropriate: allergies, current medications, past medical history, past social history and problem list.  ROS as in subjective above.     Objective:    Physical Exam Alert and in no distress otherwise not examined. Hemoglobin A1c is 7.0 Lab Review    Latest Ref Rng & Units 06/14/2023   10:37 AM 12/13/2022   12:02 PM 12/13/2022   11:26 AM 12/13/2022   11:24 AM 05/11/2022   12:18 PM  Diabetic Labs  HbA1c 4.0 - 5.6 % 7.5   7.6     Microalbumin mg/L    48.8  41.2   Micro/Creat Ratio     85.3  61.7   Chol 100 - 199 mg/dL  324      HDL >40 mg/dL  52      Calc LDL 0 - 99 mg/dL  45      Triglycerides 0 - 149 mg/dL  68      Creatinine 1.02 - 1.27 mg/dL  7.25          36/64/4034    1:15 PM 06/14/2023   10:32 AM 06/14/2023   10:24 AM 03/15/2023    9:45 AM 12/13/2022   11:04 AM  BP/Weight  Systolic BP 130 142 160 134 130  Diastolic BP 62 52 62 84 74  Wt. (Lbs) 156.2  160 158.2 155.8  BMI 24.1 kg/m2  24.33 kg/m2 24.05 kg/m2 24.96 kg/m2      Latest Ref Rng & Units 09/30/2022   12:00 AM  09/23/2021   12:00 AM  Foot/eye exam completion dates  Eye Exam No Retinopathy No Retinopathy  No Retinopathy         This result is from an external source.    [name]  reports that he has quit smoking. He has never used smokeless tobacco. He reports that he does not drink alcohol and does not use drugs.     Assessment & Plan:    Type 2 diabetes mellitus with other specified complication, without long-term current use of insulin (HCC)  Diabetic nephropathy associated with type 2 diabetes mellitus (HCC)  Hyperlipidemia associated with type 2 diabetes mellitus (HCC)  Hypertension associated with diabetes (HCC)  Need for vaccination against Streptococcus pneumoniae  Benign prostatic hyperplasia without lower urinary tract symptoms  Melanoma of scalp (HCC)  PVD (peripheral vascular disease) (HCC)  Senile purpura (HCC)  He will continue on his present medication regimen and follow-up with dermatology as well as cardiology.  Recheck here in about 6 months.

## 2023-10-19 ENCOUNTER — Encounter: Payer: Self-pay | Admitting: Family Medicine

## 2023-10-23 ENCOUNTER — Other Ambulatory Visit: Payer: Self-pay | Admitting: Family Medicine

## 2023-10-23 DIAGNOSIS — E1169 Type 2 diabetes mellitus with other specified complication: Secondary | ICD-10-CM

## 2023-11-08 DIAGNOSIS — L812 Freckles: Secondary | ICD-10-CM | POA: Diagnosis not present

## 2023-11-08 DIAGNOSIS — Z85828 Personal history of other malignant neoplasm of skin: Secondary | ICD-10-CM | POA: Diagnosis not present

## 2023-11-08 DIAGNOSIS — Z85820 Personal history of malignant melanoma of skin: Secondary | ICD-10-CM | POA: Diagnosis not present

## 2023-11-08 DIAGNOSIS — L821 Other seborrheic keratosis: Secondary | ICD-10-CM | POA: Diagnosis not present

## 2023-11-08 DIAGNOSIS — L570 Actinic keratosis: Secondary | ICD-10-CM | POA: Diagnosis not present

## 2023-11-08 DIAGNOSIS — L720 Epidermal cyst: Secondary | ICD-10-CM | POA: Diagnosis not present

## 2023-12-15 ENCOUNTER — Other Ambulatory Visit: Payer: Self-pay | Admitting: Family Medicine

## 2023-12-15 DIAGNOSIS — E1169 Type 2 diabetes mellitus with other specified complication: Secondary | ICD-10-CM

## 2023-12-25 ENCOUNTER — Other Ambulatory Visit: Payer: Self-pay | Admitting: Nurse Practitioner

## 2023-12-25 DIAGNOSIS — I152 Hypertension secondary to endocrine disorders: Secondary | ICD-10-CM

## 2023-12-26 ENCOUNTER — Other Ambulatory Visit: Payer: Self-pay | Admitting: Family Medicine

## 2023-12-26 DIAGNOSIS — N401 Enlarged prostate with lower urinary tract symptoms: Secondary | ICD-10-CM

## 2023-12-26 DIAGNOSIS — N528 Other male erectile dysfunction: Secondary | ICD-10-CM

## 2023-12-26 MED ORDER — TADALAFIL 20 MG PO TABS
ORAL_TABLET | ORAL | 0 refills | Status: DC
Start: 2023-12-26 — End: 2024-04-11

## 2023-12-27 ENCOUNTER — Other Ambulatory Visit: Payer: Self-pay

## 2023-12-27 ENCOUNTER — Other Ambulatory Visit: Payer: Self-pay | Admitting: Family Medicine

## 2023-12-27 MED ORDER — ONETOUCH VERIO VI STRP: ORAL_STRIP | 1 refills | Status: AC

## 2024-01-02 ENCOUNTER — Other Ambulatory Visit: Payer: Self-pay | Admitting: Family Medicine

## 2024-01-02 DIAGNOSIS — I152 Hypertension secondary to endocrine disorders: Secondary | ICD-10-CM

## 2024-01-02 MED ORDER — LISINOPRIL-HYDROCHLOROTHIAZIDE 20-12.5 MG PO TABS: 1.0000 | ORAL_TABLET | Freq: Every day | ORAL | 0 refills | Status: DC

## 2024-01-17 ENCOUNTER — Encounter: Payer: Self-pay | Admitting: Internal Medicine

## 2024-03-25 ENCOUNTER — Other Ambulatory Visit: Payer: Self-pay | Admitting: Family Medicine

## 2024-03-25 DIAGNOSIS — E1159 Type 2 diabetes mellitus with other circulatory complications: Secondary | ICD-10-CM

## 2024-04-06 ENCOUNTER — Other Ambulatory Visit: Payer: Self-pay | Admitting: Family Medicine

## 2024-04-06 DIAGNOSIS — E1159 Type 2 diabetes mellitus with other circulatory complications: Secondary | ICD-10-CM

## 2024-04-06 NOTE — Telephone Encounter (Signed)
 Left message for pt to call back. Pt has an appt on 04/11/24 and wanted to see if he needed a refill

## 2024-04-11 ENCOUNTER — Ambulatory Visit (INDEPENDENT_AMBULATORY_CARE_PROVIDER_SITE_OTHER): Payer: Medicare HMO | Admitting: Family Medicine

## 2024-04-11 VITALS — BP 138/64 | HR 59 | Ht 67.0 in | Wt 156.2 lb

## 2024-04-11 DIAGNOSIS — J301 Allergic rhinitis due to pollen: Secondary | ICD-10-CM | POA: Diagnosis not present

## 2024-04-11 DIAGNOSIS — E785 Hyperlipidemia, unspecified: Secondary | ICD-10-CM | POA: Diagnosis not present

## 2024-04-11 DIAGNOSIS — I739 Peripheral vascular disease, unspecified: Secondary | ICD-10-CM

## 2024-04-11 DIAGNOSIS — E1121 Type 2 diabetes mellitus with diabetic nephropathy: Secondary | ICD-10-CM | POA: Diagnosis not present

## 2024-04-11 DIAGNOSIS — N528 Other male erectile dysfunction: Secondary | ICD-10-CM | POA: Diagnosis not present

## 2024-04-11 DIAGNOSIS — C434 Malignant melanoma of scalp and neck: Secondary | ICD-10-CM

## 2024-04-11 DIAGNOSIS — E1169 Type 2 diabetes mellitus with other specified complication: Secondary | ICD-10-CM

## 2024-04-11 DIAGNOSIS — N401 Enlarged prostate with lower urinary tract symptoms: Secondary | ICD-10-CM | POA: Diagnosis not present

## 2024-04-11 DIAGNOSIS — E1159 Type 2 diabetes mellitus with other circulatory complications: Secondary | ICD-10-CM | POA: Diagnosis not present

## 2024-04-11 DIAGNOSIS — I152 Hypertension secondary to endocrine disorders: Secondary | ICD-10-CM | POA: Diagnosis not present

## 2024-04-11 LAB — POCT GLYCOSYLATED HEMOGLOBIN (HGB A1C): Hemoglobin A1C: 7.4 % — AB (ref 4.0–5.6)

## 2024-04-11 MED ORDER — ATORVASTATIN CALCIUM 40 MG PO TABS: 40.0000 mg | ORAL_TABLET | Freq: Every day | ORAL | 3 refills | Status: AC

## 2024-04-11 MED ORDER — LISINOPRIL-HYDROCHLOROTHIAZIDE 20-12.5 MG PO TABS: 1.0000 | ORAL_TABLET | Freq: Every day | ORAL | 3 refills | Status: AC

## 2024-04-11 MED ORDER — METOPROLOL TARTRATE 100 MG PO TABS: 100.0000 mg | ORAL_TABLET | Freq: Two times a day (BID) | ORAL | 3 refills | Status: AC

## 2024-04-11 MED ORDER — TADALAFIL 20 MG PO TABS: ORAL_TABLET | ORAL | 0 refills | Status: DC

## 2024-04-11 MED ORDER — METFORMIN HCL 500 MG PO TABS: 500.0000 mg | ORAL_TABLET | Freq: Two times a day (BID) | ORAL | 1 refills | Status: DC

## 2024-04-11 NOTE — Progress Notes (Signed)
   Subjective:    Patient ID: Maxwell Sanders, male    DOB: 08-03-1941, 83 y.o.   MRN: [id number]  HPI He is here for med check appointment.  His only complaint today is a sore throat that he has had for the last month.  He clears his throat a lot but does not complain of fever, chills, earache.  It was difficult for him to tell me exactly what symptoms he was having.  He would also like a refill on his Cialis .  He continues on atorvastatin  without difficulty.  He is also taking lisinopril /metoprolol .  He has had multiple skin cancers including melanoma and is being followed by dermatology for that.  He also has a history of BPH but does not seem to be having a great deal of difficulty with that.  His allergies seem to be under good control.   Review of Systems     Objective:    Physical Exam Alert and in no distress. Tympanic membranes and canals are normal. Pharyngeal area is normal. Neck is supple without adenopathy or thyromegaly. Cardiac exam shows a regular sinus rhythm without murmurs or gallops. Lungs are clear to auscultation. Hemoglobin A1c is 7.4       Assessment & Plan:  Type 2 diabetes mellitus with other specified complication, without long-term current use of insulin (HCC) - Plan: CBC with Differential/Platelet, Comprehensive metabolic panel with GFR, Lipid panel, POCT glycosylated hemoglobin (Hb A1C), metFORMIN  (GLUCOPHAGE ) 500 MG tablet  PVD (peripheral vascular disease) (HCC)  Melanoma of scalp (HCC) - Plan: CBC with Differential/Platelet, Comprehensive metabolic panel with GFR  Hypertension associated with diabetes (HCC) - Plan: CBC with Differential/Platelet, Comprehensive metabolic panel with GFR, metoprolol  tartrate (LOPRESSOR ) 100 MG tablet, lisinopril -hydrochlorothiazide  (ZESTORETIC ) 20-12.5 MG tablet  Hyperlipidemia associated with type 2 diabetes mellitus (HCC) - Plan: atorvastatin  (LIPITOR) 40 MG tablet  Diabetic nephropathy associated with type 2 diabetes  mellitus (HCC)  Seasonal allergic rhinitis due to pollen  Other male erectile dysfunction - Plan: tadalafil  (CIALIS ) 20 MG tablet  Benign prostatic hyperplasia with lower urinary tract symptoms, symptom details unspecified - Plan: tadalafil  (CIALIS ) 20 MG tablet Recommend supportive care for the sore throat as I do not see anything of any major concern.

## 2024-04-12 DIAGNOSIS — E1169 Type 2 diabetes mellitus with other specified complication: Secondary | ICD-10-CM | POA: Diagnosis not present

## 2024-04-12 DIAGNOSIS — I152 Hypertension secondary to endocrine disorders: Secondary | ICD-10-CM | POA: Diagnosis not present

## 2024-04-12 DIAGNOSIS — E1159 Type 2 diabetes mellitus with other circulatory complications: Secondary | ICD-10-CM | POA: Diagnosis not present

## 2024-04-12 DIAGNOSIS — C434 Malignant melanoma of scalp and neck: Secondary | ICD-10-CM | POA: Diagnosis not present

## 2024-04-13 ENCOUNTER — Ambulatory Visit: Payer: Self-pay | Admitting: Family Medicine

## 2024-04-13 LAB — CBC WITH DIFFERENTIAL/PLATELET
Basophils Absolute: 0.1 10*3/uL (ref 0.0–0.2)
Basos: 2 %
EOS (ABSOLUTE): 0.3 10*3/uL (ref 0.0–0.4)
Eos: 4 %
Hematocrit: 39.4 % (ref 37.5–51.0)
Hemoglobin: 12.9 g/dL — ABNORMAL LOW (ref 13.0–17.7)
Immature Grans (Abs): 0 10*3/uL (ref 0.0–0.1)
Immature Granulocytes: 0 %
Lymphocytes Absolute: 2.1 10*3/uL (ref 0.7–3.1)
Lymphs: 26 %
MCH: 32.2 pg (ref 26.6–33.0)
MCHC: 32.7 g/dL (ref 31.5–35.7)
MCV: 98 fL — ABNORMAL HIGH (ref 79–97)
Monocytes Absolute: 0.9 10*3/uL (ref 0.1–0.9)
Monocytes: 11 %
Neutrophils Absolute: 4.5 10*3/uL (ref 1.4–7.0)
Neutrophils: 57 %
Platelets: 234 10*3/uL (ref 150–450)
RBC: 4.01 x10E6/uL — ABNORMAL LOW (ref 4.14–5.80)
RDW: 12 % (ref 11.6–15.4)
WBC: 7.9 10*3/uL (ref 3.4–10.8)

## 2024-04-13 LAB — COMPREHENSIVE METABOLIC PANEL WITH GFR
ALT: 21 IU/L (ref 0–44)
AST: 27 IU/L (ref 0–40)
Albumin: 4.5 g/dL (ref 3.7–4.7)
Alkaline Phosphatase: 95 IU/L (ref 44–121)
BUN/Creatinine Ratio: 14 (ref 10–24)
BUN: 12 mg/dL (ref 8–27)
Bilirubin Total: 0.5 mg/dL (ref 0.0–1.2)
CO2: 27 mmol/L (ref 20–29)
Calcium: 9.5 mg/dL (ref 8.6–10.2)
Chloride: 99 mmol/L (ref 96–106)
Creatinine, Ser: 0.83 mg/dL (ref 0.76–1.27)
Globulin, Total: 2.4 g/dL (ref 1.5–4.5)
Glucose: 110 mg/dL — ABNORMAL HIGH (ref 70–99)
Potassium: 4.6 mmol/L (ref 3.5–5.2)
Sodium: 138 mmol/L (ref 134–144)
Total Protein: 6.9 g/dL (ref 6.0–8.5)
eGFR: 86 mL/min/{1.73_m2} (ref >59)

## 2024-04-13 LAB — LIPID PANEL
Chol/HDL Ratio: 2.3 ratio (ref 0.0–5.0)
Cholesterol, Total: 107 mg/dL (ref 100–199)
HDL: 47 mg/dL (ref >39)
LDL Chol Calc (NIH): 40 mg/dL (ref 0–99)
Triglycerides: 108 mg/dL (ref 0–149)
VLDL Cholesterol Cal: 20 mg/dL (ref 5–40)

## 2024-05-07 DIAGNOSIS — C434 Malignant melanoma of scalp and neck: Secondary | ICD-10-CM | POA: Diagnosis not present

## 2024-05-08 DIAGNOSIS — Z85820 Personal history of malignant melanoma of skin: Secondary | ICD-10-CM | POA: Diagnosis not present

## 2024-05-08 DIAGNOSIS — L720 Epidermal cyst: Secondary | ICD-10-CM | POA: Diagnosis not present

## 2024-05-08 DIAGNOSIS — L821 Other seborrheic keratosis: Secondary | ICD-10-CM | POA: Diagnosis not present

## 2024-05-08 DIAGNOSIS — Z85828 Personal history of other malignant neoplasm of skin: Secondary | ICD-10-CM | POA: Diagnosis not present

## 2024-05-08 DIAGNOSIS — L570 Actinic keratosis: Secondary | ICD-10-CM | POA: Diagnosis not present

## 2024-05-08 DIAGNOSIS — L812 Freckles: Secondary | ICD-10-CM | POA: Diagnosis not present

## 2024-06-05 ENCOUNTER — Encounter: Payer: Self-pay | Admitting: Cardiovascular Disease

## 2024-06-05 ENCOUNTER — Ambulatory Visit: Attending: Cardiovascular Disease | Admitting: Cardiovascular Disease

## 2024-06-05 VITALS — BP 128/52 | HR 66 | Ht 67.0 in | Wt 152.4 lb

## 2024-06-05 DIAGNOSIS — I739 Peripheral vascular disease, unspecified: Secondary | ICD-10-CM

## 2024-06-05 DIAGNOSIS — E785 Hyperlipidemia, unspecified: Secondary | ICD-10-CM | POA: Diagnosis not present

## 2024-06-05 DIAGNOSIS — I10 Essential (primary) hypertension: Secondary | ICD-10-CM | POA: Diagnosis not present

## 2024-06-05 NOTE — Patient Instructions (Signed)
 Medication Instructions:  No changes *If you need a refill on your cardiac medications before your next appointment, please call your pharmacy*  Lab Work: None ordered If you have labs (blood work) drawn today and your tests are completely normal, you will receive your results only by: MyChart Message (if you have MyChart) OR A paper copy in the mail If you have any lab test that is abnormal or we need to change your treatment, we will call you to review the results.  Testing/Procedures: None ordered  Follow-Up: At [facility], you and your health needs are our priority.  As part of our continuing mission to provide you with exceptional heart care, our providers are all part of one team.  This team includes your primary Cardiologist (physician) and Advanced Practice Providers or APPs (Physician Assistants and Nurse Practitioners) who all work together to provide you with the care you need, when you need it.  Your next appointment:   12 month(s)  Provider:   Dr. Alvenia Aus  We recommend signing up for the patient portal called "MyChart".  Sign up information is provided on this After Visit Summary.  MyChart is used to connect with patients for Virtual Visits (Telemedicine).  Patients are able to view lab/test results, encounter notes, upcoming appointments, etc.  Non-urgent messages can be sent to your provider as well.   To learn more about what you can do with MyChart, go to [email or web address].

## 2024-06-05 NOTE — Progress Notes (Signed)
 Cardiology Office Note   Date:  06/05/2024   ID:  Maxwell Sanders, DOB 03-23-1941, MRN [id number]  PCP:  Maxwell Norleen BROCKS, MD  Cardiologist:   Maxwell Cage, MD   No chief complaint on file.     History of Present Illness: Maxwell Sanders is a 83 y.o. male who is here today for follow-up visit regarding peripheral arterial disease.   He has known history of type 2 diabetes with peripheral neuropathy, hyperlipidemia, essential hypertension, previous remote tobacco use, BPH, erectile dysfunction and peptic ulcer disease. He has known moderately reduced ABI on the left side with occluded popliteal artery.  He is being treated medically.  Neuro Has been doing well with no chest pain, shortness of breath or palpitations.  He reports stable claudication overall.  He is more limited by arthritis.   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 Vicci   shave Left    malignant melanoma desmoplastic   Shave Left 05/05/2021   well differentiated squamous cell carinoma, base involved   SKIN BIOPSY Right 02/08/2020   well differentiated squamous cell carcinoma   SKIN BIOPSY Right 05/12/2022   well diff squmaous cell carcinoma , based involved     Current Outpatient Medications  Medication Sig Dispense Refill   aspirin 81 MG chewable tablet Chew by mouth daily.     atorvastatin  (LIPITOR) 40 MG tablet Take 1 tablet (40 mg total) by mouth daily. 90 tablet 3   Cholecalciferol (VITAMIN D3) 3000 units TABS Take by mouth.      Glucosamine-Chondroit-Vit C-Mn (GLUCOSAMINE 1500 COMPLEX PO) Take by mouth.     glucose blood (ONETOUCH VERIO) test strip Twice daily 100 each 1   Lancets (ONETOUCH DELICA PLUS LANCET33G) MISC USE 1 LANCET TO CHECK GLUCOSE ONCE DAILY 100 each 2   lisinopril -hydrochlorothiazide  (ZESTORETIC ) 20-12.5 MG tablet Take 1 tablet by mouth daily. 90 tablet 3   metFORMIN  (GLUCOPHAGE ) 500 MG tablet Take 1 tablet (500 mg total) by mouth 2 (two) times daily with a meal. 180 tablet 1   metoprolol  tartrate (LOPRESSOR ) 100 MG tablet Take 1 tablet (100 mg total) by mouth 2 (two) times daily. 180 tablet 3   Multiple Vitamins-Minerals (MULTIVITAMIN WITH MINERALS) tablet Take 1 tablet by mouth daily.     Omega-3 Fatty Acids (FISH OIL OMEGA-3 PO) Take by mouth.     sodium fluoride (FLUORISHIELD) 1.1 % GEL dental gel See admin instructions.     tadalafil  (CIALIS ) 20 MG tablet TAKE 1 TABLET BY MOUTH TWICE A WEEK 90 tablet 0   No current facility-administered medications for this visit.    Allergies:   Penicillins    Social History:  The patient  reports that he has quit smoking. He has never used smokeless tobacco. He reports that he does not drink alcohol and does not use drugs.   Family History:  The patient's family history is negative for  coronary artery disease.   ROS:  Please see the history of present illness.   Otherwise, review of systems are positive for none.   All other systems are reviewed and negative.    PHYSICAL EXAM: VS:  BP (!) 128/52   Pulse 66   Ht 5' 7 (1.702 m)   Wt 152 lb 6.4 oz (69.1 kg)   SpO2 99%   BMI 23.87 kg/m  , BMI Body mass index is 23.87 kg/m. GEN: Well nourished, well developed, in no acute distress  HEENT: normal  Neck: no JVD, carotid bruits, or masses Cardiac: RRR; no murmurs, rubs, or gallops,no edema  Respiratory:  clear to auscultation bilaterally, normal work of breathing GI: soft, nontender, nondistended, + BS MS: no deformity or atrophy  Skin: warm and  dry, no rash Neuro:  Strength and sensation are intact Psych: euthymic mood, full affect Vascular: Pedal pulses are palpable on the right side but not the left side.  No lower extremity ulceration.   EKG:  EKG is ordered today. The ekg ordered today demonstrates: Sinus rhythm with 1st degree A-V block When compared with ECG of 03-Nov-2000 07:32, PR interval has increased QT has shortened    Recent Labs: 04/12/2024: ALT 21; BUN 12; Creatinine, Ser 0.83; Hemoglobin 12.9; Platelets 234; Potassium 4.6; Sodium 138    Lipid Panel    Component Value Date/Time   CHOL 107 04/12/2024 1529   TRIG 108 04/12/2024 1529   HDL 47 04/12/2024 1529   CHOLHDL 2.3 04/12/2024 1529   CHOLHDL 2.6 07/27/2017 1003   VLDL 15 07/22/2016 1013   LDLCALC 40 04/12/2024 1529   LDLCALC 70 07/27/2017 1003      Wt Readings from Last 3 Encounters:  06/05/24 152 lb 6.4 oz (69.1 kg)  04/11/24 156 lb 3.2 oz (70.9 kg)  10/12/23 156 lb 12.8 oz (71.1 kg)           No data to display            ASSESSMENT AND PLAN:  1.  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  He has mild left calf claudication that is not lifestyle limiting at the present time.  Recommend continuing medical therapy.    2.  Essential hypertension: Blood pressure is well controlled on current medications.  3.  Hyperlipidemia: I reviewed most recent lipid profile done in May which showed an LDL of 40.  Continue atorvastatin .    Disposition:   FU with me in 1 year  Signed,  Maxwell Cage, MD  06/05/2024 10:55 AM    [facility]

## 2024-09-18 ENCOUNTER — Ambulatory Visit: Payer: Medicare HMO

## 2024-09-18 VITALS — BP 138/80 | HR 70 | Temp 99.0°F | Ht 67.0 in | Wt 154.4 lb

## 2024-09-18 DIAGNOSIS — Z23 Encounter for immunization: Secondary | ICD-10-CM | POA: Diagnosis not present

## 2024-09-18 DIAGNOSIS — Z0000 Encounter for general adult medical examination without abnormal findings: Secondary | ICD-10-CM | POA: Diagnosis not present

## 2024-09-18 NOTE — Progress Notes (Signed)
 Subjective:   Maxwell Sanders is a 83 y.o. who presents for a Medicare Wellness preventive visit.  As a reminder, Annual Wellness Visits don't include a physical exam, and some assessments may be limited, especially if this visit is performed virtually. We may recommend an in-person follow-up visit with your provider if needed.  Visit Complete: In person    Persons Participating in Visit: Patient.  AWV Questionnaire: Yes: Patient Medicare AWV questionnaire was completed by the patient on 09/18/2024; I have confirmed that all information answered by patient is correct and no changes since this date.  Cardiac Risk Factors include: advanced age (>43men, >57 women);diabetes mellitus;dyslipidemia;hypertension;male gender     Objective:    Today's Vitals   09/18/24 1004  BP: 138/80  Pulse: 70  Temp: 99 F (37.2 C)  TempSrc: Oral  SpO2: 99%  Weight: 154 lb 6.4 oz (70 kg)  Height: 5' 7 (1.702 m)   Body mass index is 24.18 kg/m.     09/18/2024   10:12 AM 09/06/2023    1:20 PM 09/17/2022    9:59 AM 09/15/2021   10:26 AM 08/14/2020   10:14 AM 08/02/2018   10:11 AM 07/22/2016    9:35 AM  Advanced Directives  Does Patient Have a Medical Advance Directive? Yes Yes Yes Yes Yes Yes  Yes   Type of Estate Agent of [place];Living will Healthcare Power of [place];Living will Healthcare Power of [place];Living will Living will;Healthcare Power of Attorney Living will Healthcare Power of [place];Living will Healthcare Power of [place];Living will   Does patient want to make changes to medical advance directive?      No - Patient declined    Copy of Healthcare Power of Attorney in Chart? No - copy requested No - copy requested No - copy requested No - copy requested  No - copy requested  No - copy requested      Data saved with a previous flowsheet row definition    Current Medications (verified) Outpatient Encounter Medications as of 09/18/2024  Medication  Sig   aspirin 81 MG chewable tablet Chew by mouth daily.   atorvastatin  (LIPITOR) 40 MG tablet Take 1 tablet (40 mg total) by mouth daily.   Cholecalciferol (VITAMIN D3) 3000 units TABS Take by mouth.   Glucosamine-Chondroit-Vit C-Mn (GLUCOSAMINE 1500 COMPLEX PO) Take by mouth.   glucose blood (ONETOUCH VERIO) test strip Twice daily   Lancets (ONETOUCH DELICA PLUS LANCET33G) MISC USE 1 LANCET TO CHECK GLUCOSE ONCE DAILY   lisinopril -hydrochlorothiazide  (ZESTORETIC ) 20-12.5 MG tablet Take 1 tablet by mouth daily.   metFORMIN  (GLUCOPHAGE ) 500 MG tablet Take 1 tablet (500 mg total) by mouth 2 (two) times daily with a meal.   metoprolol  tartrate (LOPRESSOR ) 100 MG tablet Take 1 tablet (100 mg total) by mouth 2 (two) times daily.   Multiple Vitamins-Minerals (MULTIVITAMIN WITH MINERALS) tablet Take 1 tablet by mouth daily.   Omega-3 Fatty Acids (FISH OIL OMEGA-3 PO) Take by mouth.   sodium fluoride (FLUORISHIELD) 1.1 % GEL dental gel See admin instructions.   tadalafil  (CIALIS ) 20 MG tablet TAKE 1 TABLET BY MOUTH TWICE A WEEK   No facility-administered encounter medications on file as of 09/18/2024.    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 Vicci   shave Left    malignant melanoma desmoplastic   Shave Left 05/05/2021   well differentiated squamous cell carinoma, base involved   SKIN BIOPSY Right 02/08/2020   well differentiated squamous cell carcinoma   SKIN BIOPSY Right  05/12/2022   well diff squmaous cell carcinoma , based involved   History reviewed. No pertinent family history. Social History   Socioeconomic History   Marital status: Married    Spouse name: Not on file   Number of children: Not on file   Years of education: Not on file   Highest education level: Some college, no degree  Occupational History   Not on file  Tobacco Use   Smoking status: Former   Smokeless tobacco: Never  Vaping Use   Vaping status: Never Used  Substance and Sexual Activity   Alcohol use: No   Drug use: No   Sexual activity: Not Currently  Other Topics Concern   Not on file  Social History Narrative   Not on file   Social Drivers of Health   Financial Resource Strain: Low Risk  (09/18/2024)   Overall Financial Resource Strain (CARDIA)    Difficulty of Paying Living Expenses: Not hard at all  Food Insecurity: No Food Insecurity (09/18/2024)   Hunger Vital Sign    Worried About Running Out of Food in the Last Year: Never true    Ran Out of Food in the Last Year: Never true  Transportation Needs: No Transportation Needs (09/18/2024)   PRAPARE - Administrator, Civil Service (Medical): No    Lack of Transportation (Non-Medical): No  Physical Activity: Sufficiently Active (09/18/2024)   Exercise Vital Sign    Days of Exercise per Week: 5 days    Minutes of Exercise per Session: 30 min  Stress: No Stress Concern Present (09/18/2024)   [employer] of Occupational Health - Occupational Stress Questionnaire    Feeling of Stress: Not at all  Social Connections: Moderately Isolated (09/18/2024)   Social Connection and Isolation Panel    Frequency of Communication with Friends and Family: Three times a week    Frequency of Social Gatherings with Friends and Family: Once a week    Attends Religious Services: Never    Database Administrator or Organizations: No    Attends Engineer, Structural: Not on file    Marital Status: Married     Tobacco Counseling Counseling given: Not Answered    Clinical Intake:  Pre-visit preparation completed: Yes  Pain : No/denies pain     Nutritional Status: BMI of 19-24  Normal Nutritional Risks: None Diabetes: Yes CBG done?: No Did pt. bring in CBG monitor from home?: No  Lab Results  Component Value Date   HGBA1C 7.4 (A) 04/11/2024   HGBA1C 7.0 (A) 10/12/2023   HGBA1C 7.5 (A) 06/14/2023     How often do you need to have someone help you when you read instructions, pamphlets, or other written materials from your doctor or pharmacy?: 1 - Never  Interpreter Needed?: No  Information entered by :: NAllen LPN   Activities of Daily Living  09/18/2024    4:13 AM  In your present state of health, do you have any difficulty performing the following activities:  Hearing? 0  Vision? 0  Difficulty concentrating or making decisions? 0  Walking or climbing stairs? 0  Dressing or bathing? 0  Doing errands, shopping? 0  Preparing Food and eating ? N  Using the Toilet? N  In the past six months, have you accidently leaked urine? N  Do you have problems with loss of bowel control? N  Managing your Medications? N  Managing your Finances? N  Housekeeping or managing your Housekeeping? N    Patient Care Team: Joyce Norleen BROCKS, MD as PCP - General (Family Medicine) Darron Deatrice LABOR, MD as PCP - Cardiology (Cardiology) [facility]  I have updated your Care Teams any recent Medical Services you may have received from other providers in the past year.     Assessment:   This is a routine wellness examination for Maxwell Sanders.  Hearing/Vision screen Hearing Screening - Comments:: Denies hearing issues Vision Screening - Comments:: Regular eye exams, [facility]   Goals Addressed             This Visit's Progress    Patient Stated       09/18/2024, stay alive       Depression Screen     09/18/2024   10:13 AM 04/11/2024   11:08 AM 09/06/2023     1:21 PM 09/17/2022   10:00 AM 09/15/2021   10:28 AM 08/14/2020   10:08 AM 08/14/2019   10:34 AM  PHQ 2/9 Scores  PHQ - 2 Score 0 0 0 0 0 0 0  PHQ- 9 Score   5 0       Fall Risk     09/18/2024    4:13 AM 04/11/2024   11:08 AM 09/06/2023   11:30 AM 12/13/2022   11:11 AM 09/17/2022   10:00 AM  Fall Risk   Falls in the past year? 0 0 0 0 0  Number falls in past yr: 0 0 0 0 0  Injury with Fall? 0 0 0 0 0  Risk for fall due to : Medication side effect No Fall Risks Medication side effect No Fall Risks Medication side effect  Follow up Falls prevention discussed;Falls evaluation completed Falls evaluation completed Falls prevention discussed;Falls evaluation completed Falls evaluation completed  Falls prevention discussed;Falls evaluation completed;Education provided      Data saved with a previous flowsheet row definition    MEDICARE RISK AT HOME:  Medicare Risk at Home Any stairs in or around the home?: (Patient-Rptd) Yes If so, are there any without handrails?: (Patient-Rptd) No Home free of loose throw rugs in walkways, pet beds, electrical cords, etc?: (Patient-Rptd) No Life alert?: (Patient-Rptd) No Use of a cane, walker or w/c?: (Patient-Rptd) No Grab bars in the bathroom?: (Patient-Rptd) Yes Shower chair or bench in shower?: (Patient-Rptd) No Elevated toilet seat or a handicapped toilet?: (Patient-Rptd) No  TIMED UP AND GO:  Was the test performed?  Yes  Length of time to ambulate 10 feet: 5 sec Gait steady and fast without use of assistive device  Cognitive Function: 6CIT completed        09/18/2024   10:13 AM 09/06/2023    1:24 PM 09/17/2022   10:08 AM  6CIT Screen  What Year? 0 points 0 points 0 points  What month? 0 points 0 points 0 points  What time? 0 points 0 points 0 points  Count back from 20 0 points 0 points 0 points  Months in reverse 0 points 0 points 0 points  Repeat phrase 0 points 0 points 4 points  Total Score 0 points 0 points 4 points     Immunizations Immunization History  Administered Date(s) Administered   DT (Pediatric) 11/07/2002   Fluad Quad(high Dose 65+) 08/14/2019, 08/14/2020, 09/15/2021, 09/17/2022   Fluad Trivalent(High Dose 65+) 09/06/2023   INFLUENZA, HIGH DOSE SEASONAL PF 12/02/2015, 07/22/2016, 07/27/2017, 12/12/2018, 09/18/2024   Influenza, Seasonal, Injecte, Preservative Fre 12/07/2012   PFIZER Comirnaty (Gray Top)Covid-19 Tri-Sucrose Vaccine 04/21/2021   PFIZER(Purple Top)SARS-COV-2 Vaccination 12/24/2019, 01/14/2020, 08/14/2020   PNEUMOCOCCAL CONJUGATE-20 10/12/2023   Pfizer Covid-19 Vaccine Bivalent Booster 50yrs & up 09/15/2021   Pfizer(Comirnaty )Fall Seasonal Vaccine 12 years and older 12/13/2022   Pneumococcal Conjugate-13 06/21/2013   Pneumococcal Polysaccharide-23 05/11/2006   Respiratory Syncytial Virus Vaccine,Recomb Aduvanted(Arexvy) 01/03/2023   Tdap 04/21/2012   Zoster Recombinant(Shingrix) 04/10/2018, 06/17/2018   Zoster, Live 03/14/2008    Screening Tests Health Maintenance  Topic Date Due   DTaP/Tdap/Td (3 - Td or Tdap) 04/21/2022   FOOT EXAM  09/15/2022   Diabetic kidney evaluation - Urine ACR  12/14/2023   COVID-19 Vaccine (7 - 2025-26 season) 07/23/2024   OPHTHALMOLOGY EXAM  10/04/2024   HEMOGLOBIN A1C  10/12/2024   Diabetic kidney evaluation - eGFR measurement  04/12/2025   Medicare Annual Wellness (AWV)  09/18/2025   Pneumococcal Vaccine: 50+ Years  Completed   Influenza Vaccine  Completed   Zoster Vaccines- Shingrix  Completed   Meningococcal B Vaccine  Aged Out    Health Maintenance Items Addressed: Vaccines Due: flu  Additional Screening:  Vision Screening: Recommended annual ophthalmology exams for early detection of glaucoma and other disorders of the eye. Is the patient up to date with their annual eye exam?  Yes  Who is the provider or what is the name of the office in which the patient attends annual eye exams? [facility]  Dental Screening:  Recommended annual dental exams for proper oral hygiene  Community Resource Referral / Chronic Care Management: CRR required this visit?  No   CCM required this visit?  No   Plan:    I have personally reviewed and noted the following in the patient's chart:   Medical and social history Use of alcohol, tobacco or illicit drugs  Current medications and supplements including opioid prescriptions. Patient is not currently taking opioid prescriptions. Functional ability and status Nutritional status Physical activity Advanced directives List of other physicians Hospitalizations, surgeries, and ER visits in previous 12 months Vitals Screenings to include cognitive, depression, and falls Referrals and appointments  In addition, I have reviewed and discussed with patient certain preventive protocols, quality metrics, and best practice recommendations. A written personalized care plan for preventive services as well as general preventive health recommendations were provided to patient.   Ardella FORBES Dawn, LPN   [id number]   After Visit Summary: (In Person-Printed) AVS printed and given to the patient  Notes: Nothing significant to report at this time.

## 2024-09-18 NOTE — Patient Instructions (Addendum)
 [name],  Thank you for taking the time for your Medicare Wellness Visit. I appreciate your continued commitment to your health goals. Please review the care plan we discussed, and feel free to reach out if I can assist you further.  Medicare recommends these wellness visits once per year to help you and your care team stay ahead of potential health issues. These visits are designed to focus on prevention, allowing your provider to concentrate on managing your acute and chronic conditions during your regular appointments.  Please note that Annual Wellness Visits do not include a physical exam. Some assessments may be limited, especially if the visit was conducted virtually. If needed, we may recommend a separate in-person follow-up with your provider.  Ongoing Care Seeing your primary care provider every 3 to 6 months helps us  monitor your health and provide consistent, personalized care.   Referrals If a referral was made during today's visit and you haven't received any updates within two weeks, please contact the referred provider directly to check on the status.  Recommended Screenings:  Health Maintenance  Topic Date Due   DTaP/Tdap/Td vaccine (3 - Td or Tdap) 04/21/2022   Complete foot exam   09/15/2022   Yearly kidney health urinalysis for diabetes  12/14/2023   COVID-19 Vaccine (7 - 2025-26 season) 07/23/2024   Eye exam for diabetics  10/04/2024   Hemoglobin A1C  10/12/2024   Yearly kidney function blood test for diabetes  04/12/2025   Medicare Annual Wellness Visit  09/18/2025   Pneumococcal Vaccine for age over 13  Completed   Flu Shot  Completed   Zoster (Shingles) Vaccine  Completed   Meningitis B Vaccine  Aged Out       09/18/2024   10:12 AM  Advanced Directives  Does Patient Have a Medical Advance Directive? Yes  Type of Estate Agent of [place];Living will  Copy of Healthcare Power of Attorney in Chart? No - copy requested   Advance  Care Planning is important because it: Ensures you receive medical care that aligns with your values, goals, and preferences. Provides guidance to your family and loved ones, reducing the emotional burden of decision-making during critical moments.  Vision: Annual vision screenings are recommended for early detection of glaucoma, cataracts, and diabetic retinopathy. These exams can also reveal signs of chronic conditions such as diabetes and high blood pressure.  Dental: Annual dental screenings help detect early signs of oral cancer, gum disease, and other conditions linked to overall health, including heart disease and diabetes.  Please see the attached documents for additional preventive care recommendations.

## 2024-09-27 ENCOUNTER — Ambulatory Visit (INDEPENDENT_AMBULATORY_CARE_PROVIDER_SITE_OTHER): Payer: Self-pay | Admitting: Family Medicine

## 2024-09-27 ENCOUNTER — Encounter: Payer: Self-pay | Admitting: Family Medicine

## 2024-09-27 VITALS — BP 128/68 | HR 59 | Ht 67.0 in | Wt 154.4 lb

## 2024-09-27 DIAGNOSIS — N401 Enlarged prostate with lower urinary tract symptoms: Secondary | ICD-10-CM | POA: Diagnosis not present

## 2024-09-27 DIAGNOSIS — E1159 Type 2 diabetes mellitus with other circulatory complications: Secondary | ICD-10-CM

## 2024-09-27 DIAGNOSIS — Z0000 Encounter for general adult medical examination without abnormal findings: Secondary | ICD-10-CM | POA: Diagnosis not present

## 2024-09-27 DIAGNOSIS — E1169 Type 2 diabetes mellitus with other specified complication: Secondary | ICD-10-CM | POA: Diagnosis not present

## 2024-09-27 DIAGNOSIS — C434 Malignant melanoma of scalp and neck: Secondary | ICD-10-CM | POA: Diagnosis not present

## 2024-09-27 DIAGNOSIS — E1121 Type 2 diabetes mellitus with diabetic nephropathy: Secondary | ICD-10-CM

## 2024-09-27 DIAGNOSIS — D692 Other nonthrombocytopenic purpura: Secondary | ICD-10-CM | POA: Diagnosis not present

## 2024-09-27 DIAGNOSIS — Z23 Encounter for immunization: Secondary | ICD-10-CM

## 2024-09-27 DIAGNOSIS — I152 Hypertension secondary to endocrine disorders: Secondary | ICD-10-CM

## 2024-09-27 DIAGNOSIS — N528 Other male erectile dysfunction: Secondary | ICD-10-CM | POA: Diagnosis not present

## 2024-09-27 DIAGNOSIS — E785 Hyperlipidemia, unspecified: Secondary | ICD-10-CM

## 2024-09-27 DIAGNOSIS — I739 Peripheral vascular disease, unspecified: Secondary | ICD-10-CM | POA: Diagnosis not present

## 2024-09-27 DIAGNOSIS — J301 Allergic rhinitis due to pollen: Secondary | ICD-10-CM

## 2024-09-27 LAB — POCT GLYCOSYLATED HEMOGLOBIN (HGB A1C): Hemoglobin A1C: 6.9 % — AB (ref 4.0–5.6)

## 2024-09-27 MED ORDER — TADALAFIL 20 MG PO TABS: ORAL_TABLET | ORAL | 0 refills | Status: AC

## 2024-09-27 MED ORDER — METFORMIN HCL 500 MG PO TABS
500.0000 mg | ORAL_TABLET | Freq: Two times a day (BID) | ORAL | 1 refills | Status: AC
Start: 2024-09-27 — End: ?

## 2024-09-27 NOTE — Progress Notes (Signed)
 Complete physical exam  Patient: FRAZIER BALFOUR   DOB: 1940-12-26   83 y.o. Male  MRN: [id number]  Subjective:    Chief Complaint  Patient presents with   Annual Exam    RODD HEFT is a 83 y.o. male who presents today for a complete physical exam.  He reports consuming a general diet. The patient does not participate in regular exercise at present. He generally feels well. He reports sleeping fairly well. Discussed the use of AI scribe software for clinical note transcription with the patient, who gave verbal consent to proceed.  He has diabetes and is currently taking metformin  without issues. His last blood work was done in May.  He is on atorvastatin  for hyperlipidemia, taking one pill daily. He is also on lisinopril  and metoprolol  for hypertension, which he takes regularly.  He has a history of melanoma and is seeing a dermatologist regularly for follow-up, with his next appointment scheduled for June. He also sees an eye doctor regularly, with his next visit in December.  He has peripheral vascular disease and reports a past incident where a clot in his left leg formed its own bypass, allowing for circulation without the need for stents. He follows up periodically with his vascular specialist, with the last visit a few months ago. He continues on metoprolol  and lisinopril  with no difficulty. He takes tadalafil  for urinary symptoms related to his prostate, taking a full dosage every three days. He reports variable effectiveness, noting that sometimes he experiences a 'real good stream' and other times a 'little needle stream.' He mentions getting up multiple times at night to urinate, typically three times. He received his flu shot last week but has not yet received the COVID or tetanus vaccines. He attempted to get these vaccines at the drugstore but has not yet done so.  No issues with allergies. He denies smoking and drinking.      Most recent fall risk assessment:     09/27/2024   10:21 AM  Fall Risk   Falls in the past year? 0  Number falls in past yr: 0  Injury with Fall? 0  Risk for fall due to : No Fall Risks  Follow up Falls evaluation completed     Most recent depression screenings:    09/27/2024   10:21 AM 09/18/2024   10:13 AM  PHQ 2/9 Scores  PHQ - 2 Score 0 0    Vision:[facility]  Appt in December Dermatology: [facility]: [facility]  Dentist: [facility] every 6 months     Immunization History  Administered Date(s) Administered   DT (Pediatric) 11/07/2002   Fluad Quad(high Dose 65+) 08/14/2019, 08/14/2020, 09/15/2021, 09/17/2022   Fluad Trivalent(High Dose 65+) 09/06/2023   INFLUENZA, HIGH DOSE SEASONAL PF 12/02/2015, 07/22/2016, 07/27/2017, 12/12/2018, 09/18/2024   Influenza, Seasonal, Injecte, Preservative Fre 12/07/2012   PFIZER Comirnaty [place] Top)Covid-19 Tri-Sucrose Vaccine 04/21/2021   PFIZER(Purple Top)SARS-COV-2 Vaccination 12/24/2019, 01/14/2020, 08/14/2020   PNEUMOCOCCAL CONJUGATE-20 10/12/2023   Pfizer Covid-19 Vaccine Bivalent Booster 69yrs & up 09/15/2021   Pfizer(Comirnaty )Fall Seasonal Vaccine 12 years and older 12/13/2022   Pneumococcal Conjugate-13 06/21/2013   Pneumococcal Polysaccharide-23 05/11/2006   Respiratory Syncytial Virus Vaccine,Recomb Aduvanted(Arexvy) 01/03/2023   Tdap 04/21/2012   Zoster Recombinant(Shingrix) 04/10/2018, 06/17/2018   Zoster, Live 03/14/2008    Health Maintenance  Topic Date Due   DTaP/Tdap/Td (3 - Td or Tdap) 04/21/2022   FOOT EXAM  09/15/2022   Diabetic kidney evaluation - Urine ACR  12/14/2023   COVID-19 Vaccine (7 - 2025-26 season) 10/13/2024 (Originally 07/23/2024)   OPHTHALMOLOGY EXAM  10/04/2024   HEMOGLOBIN A1C  03/27/2025   Diabetic kidney evaluation - eGFR measurement  04/12/2025   Medicare Annual Wellness (AWV)  09/18/2025   Pneumococcal Vaccine: 50+ Years  Completed   Influenza Vaccine  Completed   Zoster Vaccines- Shingrix   Completed   Meningococcal B Vaccine  Aged Out    Patient Care Team: Joyce Norleen BROCKS, MD as PCP - General (Family Medicine) Darron Deatrice LABOR, MD as PCP - Cardiology (Cardiology) [facility]   Outpatient Medications Prior to Visit  Medication Sig   aspirin 81 MG chewable tablet Chew by mouth daily.   atorvastatin  (LIPITOR) 40 MG tablet Take 1 tablet (40 mg total) by mouth daily.   Cholecalciferol (VITAMIN D3) 3000 units TABS Take by mouth.   Glucosamine-Chondroit-Vit C-Mn (GLUCOSAMINE 1500 COMPLEX PO) Take by mouth.   glucose blood (ONETOUCH VERIO) test strip Twice daily   Lancets (ONETOUCH DELICA PLUS LANCET33G) MISC USE 1 LANCET TO CHECK GLUCOSE ONCE DAILY   lisinopril -hydrochlorothiazide  (ZESTORETIC ) 20-12.5 MG tablet Take 1 tablet by mouth daily.   metoprolol  tartrate (LOPRESSOR ) 100 MG tablet Take 1 tablet (100 mg total) by mouth 2 (two) times daily.   Multiple Vitamins-Minerals (MULTIVITAMIN WITH MINERALS) tablet Take 1 tablet by mouth daily.   Omega-3 Fatty Acids (FISH OIL OMEGA-3 PO) Take by mouth.   sodium fluoride (FLUORISHIELD) 1.1 % GEL dental gel See admin instructions.   [DISCONTINUED] metFORMIN  (GLUCOPHAGE ) 500 MG tablet Take 1 tablet (500 mg total) by mouth 2 (two) times daily with a meal.   [DISCONTINUED] tadalafil  (CIALIS ) 20 MG tablet TAKE 1 TABLET BY MOUTH TWICE A WEEK   No facility-administered medications prior to visit.    Review of Systems  All other systems reviewed and are negative.   Family and social history as well as health maintenance and immunizations was reviewed.     Objective:    BP 128/68   Pulse (!) 59   Ht 5' 7 (1.702 m)   Wt 154 lb 6.4 oz (70 kg)   SpO2 97%   BMI 24.18 kg/m    Physical Exam  Alert and in no distress.  Neck is supple without adenopathy or thyromegaly.  Cardiac exam shows regular rhythm without murmurs or gallops.  Lungs are clear to auscultation.  Purpuric lesions noted on the arms.  Hemoglobin A1c  is 6.9     Assessment & Plan:    Discussed health benefits of physical activity, and encouraged him to engage in regular exercise appropriate for his age and condition. Adult Wellness Visit Routine wellness visit with immunizations up to date except COVID and tetanus. - Continue routine wellness visits. - Obtain COVID and tetanus vaccinations at the pharmacy.  Type 2 diabetes mellitus Managed with metformin . A1c to be checked at next visit. - Continue metformin . - Check A1c at next visit.  Peripheral vascular disease, left leg, status post spontaneous bypass Adequate circulation without intervention. - Continue follow-up with vascular specialist as needed.  Malignant melanoma, scalp/neck, under surveillance Under regular dermatology surveillance. - Continue regular dermatology follow-ups.  Benign prostatic hyperplasia with lower urinary tract symptoms Managed with tadalafil  for lower urinary tract symptoms. Current regimen effective and cost-efficient. - Continue tadalafil  20 mg every three days.  Allergic rhinitis due to pollen Continue current management.     Diabetic nephropathy associated with type 2 diabetes mellitus (HCC)  Hyperlipidemia associated with type 2  diabetes mellitus (HCC)  Hypertension associated with diabetes (HCC)   Senile purpura  Other male erectile dysfunction - Plan: tadalafil  (CIALIS ) 20 MG tablet     Norleen Jobs, MD

## 2024-10-24 DIAGNOSIS — E119 Type 2 diabetes mellitus without complications: Secondary | ICD-10-CM | POA: Diagnosis not present

## 2024-10-24 DIAGNOSIS — H04123 Dry eye syndrome of bilateral lacrimal glands: Secondary | ICD-10-CM | POA: Diagnosis not present

## 2024-10-24 DIAGNOSIS — H02135 Senile ectropion of left lower eyelid: Secondary | ICD-10-CM | POA: Diagnosis not present

## 2024-10-24 DIAGNOSIS — H43812 Vitreous degeneration, left eye: Secondary | ICD-10-CM | POA: Diagnosis not present

## 2024-10-24 DIAGNOSIS — Z961 Presence of intraocular lens: Secondary | ICD-10-CM | POA: Diagnosis not present

## 2024-10-24 DIAGNOSIS — H02132 Senile ectropion of right lower eyelid: Secondary | ICD-10-CM | POA: Diagnosis not present

## 2024-11-01 LAB — OPHTHALMOLOGY REPORT-SCANNED

## 2024-11-07 DIAGNOSIS — L853 Xerosis cutis: Secondary | ICD-10-CM | POA: Diagnosis not present

## 2024-11-07 DIAGNOSIS — Z85820 Personal history of malignant melanoma of skin: Secondary | ICD-10-CM | POA: Diagnosis not present

## 2024-11-07 DIAGNOSIS — L723 Sebaceous cyst: Secondary | ICD-10-CM | POA: Diagnosis not present

## 2024-11-07 DIAGNOSIS — L858 Other specified epidermal thickening: Secondary | ICD-10-CM | POA: Diagnosis not present

## 2024-11-07 DIAGNOSIS — L821 Other seborrheic keratosis: Secondary | ICD-10-CM | POA: Diagnosis not present

## 2024-11-07 DIAGNOSIS — Z85828 Personal history of other malignant neoplasm of skin: Secondary | ICD-10-CM | POA: Diagnosis not present

## 2024-11-07 DIAGNOSIS — L570 Actinic keratosis: Secondary | ICD-10-CM | POA: Diagnosis not present

## 2024-12-28 NOTE — Progress Notes (Signed)
 INMER NIX                                          MRN: [id number]   12/28/2024   The [facility] Quality Team Specialist reviewed this patient medical record for the purposes of chart review for care gap closure. The following were reviewed: chart review for care gap closure-kidney health evaluation for diabetes:eGFR  and uACR.    [facility] Quality Team

## 2025-03-27 ENCOUNTER — Ambulatory Visit: Admitting: Family Medicine
